# Patient Record
Sex: Female | Born: 2000 | Race: White | Hispanic: No | Marital: Single | Smoking: Never smoker
Health system: Southern US, Community
[De-identification: ages and names within clinical notes are randomized; demographics above are authoritative.]

## PROBLEM LIST (undated history)

## (undated) DIAGNOSIS — J3089 Other allergic rhinitis: Secondary | ICD-10-CM

## (undated) DIAGNOSIS — R300 Dysuria: Secondary | ICD-10-CM

## (undated) DIAGNOSIS — N920 Excessive and frequent menstruation with regular cycle: Secondary | ICD-10-CM

## (undated) DIAGNOSIS — R82998 Other abnormal findings in urine: Secondary | ICD-10-CM

## (undated) DIAGNOSIS — D649 Anemia, unspecified: Secondary | ICD-10-CM

## (undated) HISTORY — DX: Dysuria: R30.0

## (undated) HISTORY — DX: Excessive and frequent menstruation with regular cycle: N92.0

## (undated) HISTORY — DX: Other abnormal findings in urine: R82.998

---
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
## 2020-02-22 IMAGING — US US RENAL
1 series · 14 of 25 positions shown · non-contrast
Comparison: None.

CLINICAL DATA: Recurring urinary tract infection

EXAM:
RENAL / URINARY TRACT ULTRASOUND COMPLETE

[Series 1: us renal · 0.24mm/px · 14 of 33 slices shown]
[im 1/33]
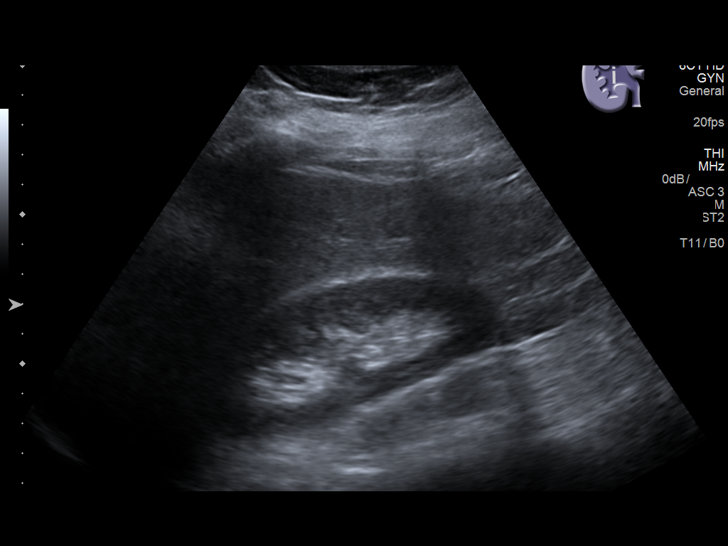
[im 3/33]
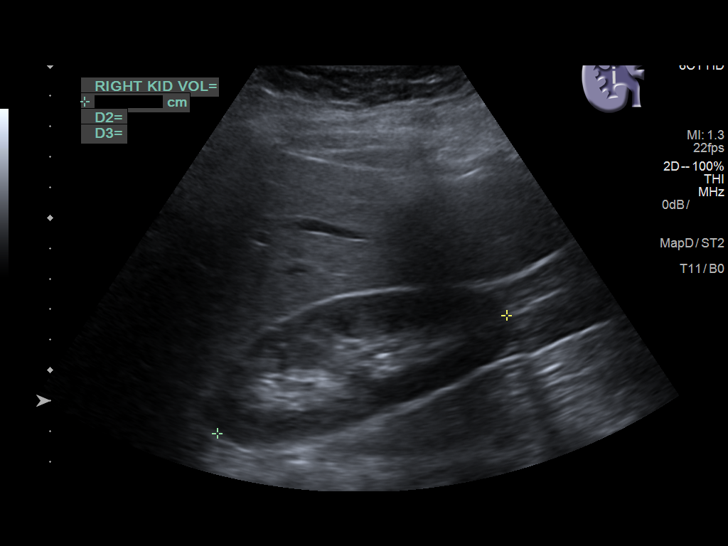
[im 6/33]
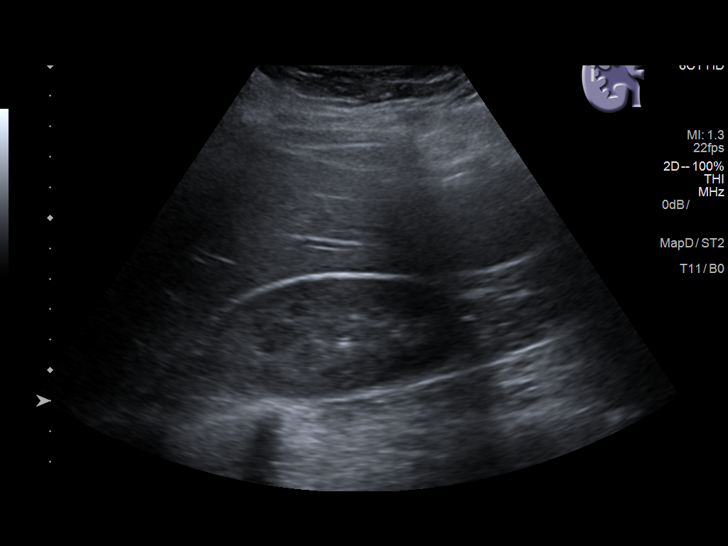
[im 9/33]
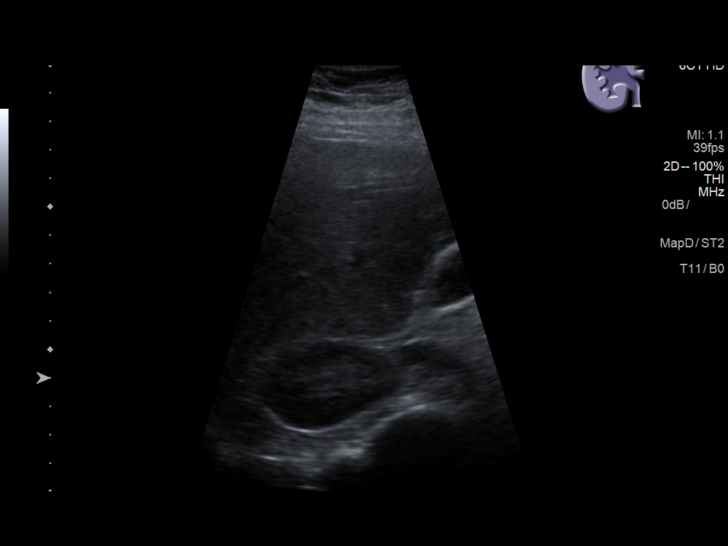
[im 11/33]
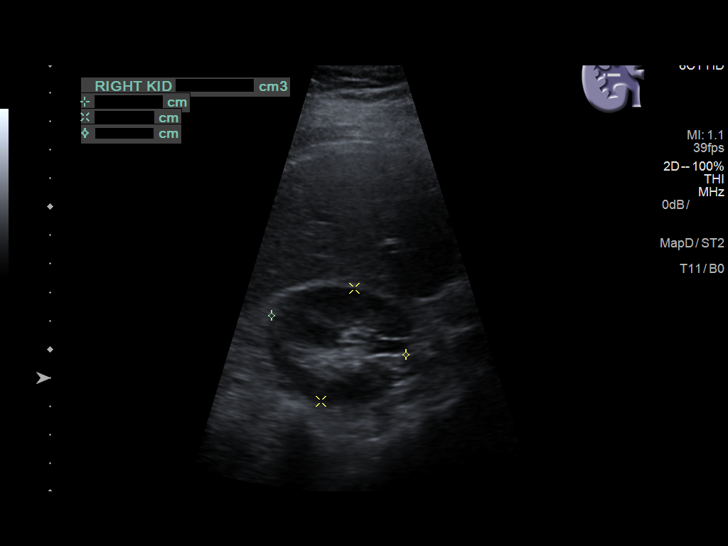
[im 13/33]
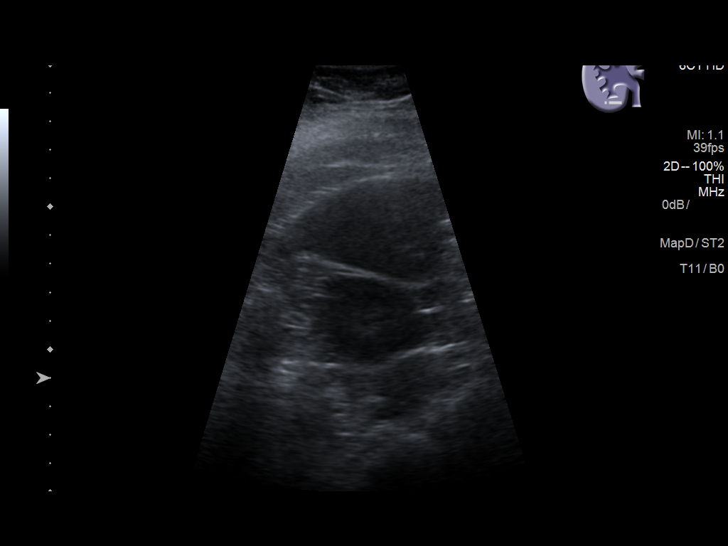
[im 15/33]
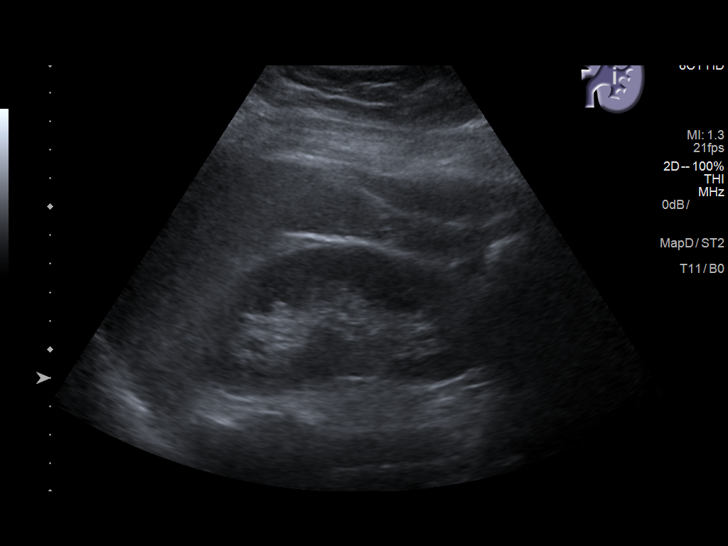
[im 18/33]
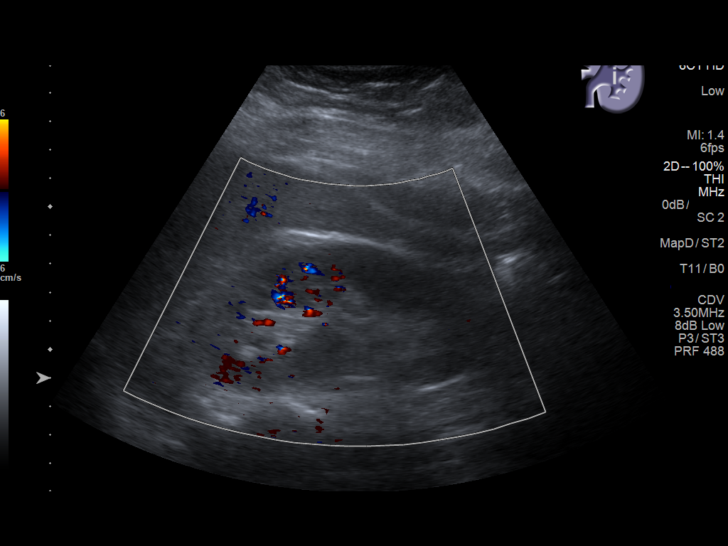
[im 21/33]
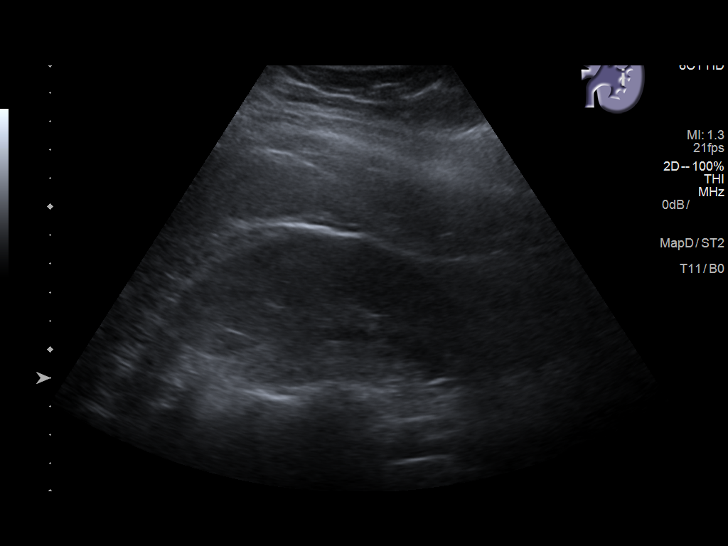
[im 22/33]
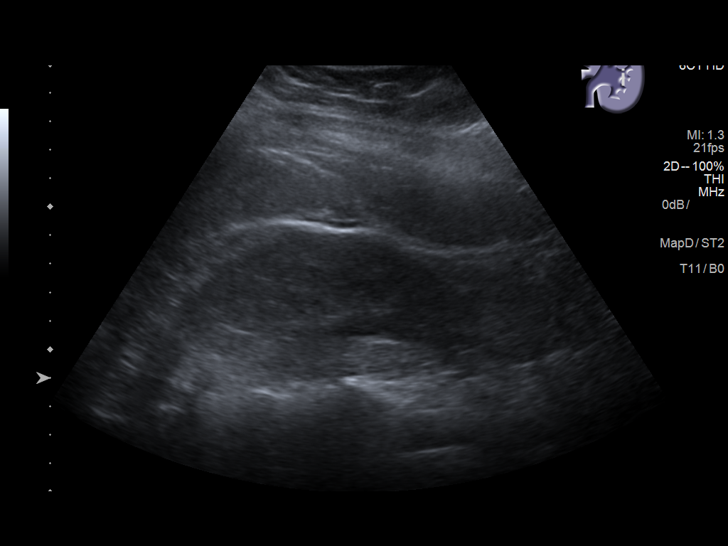
[im 25/33]
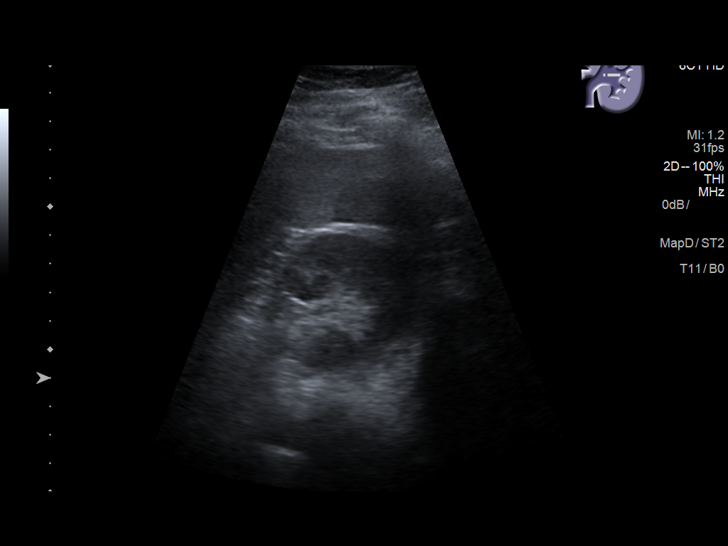
[im 27/33]
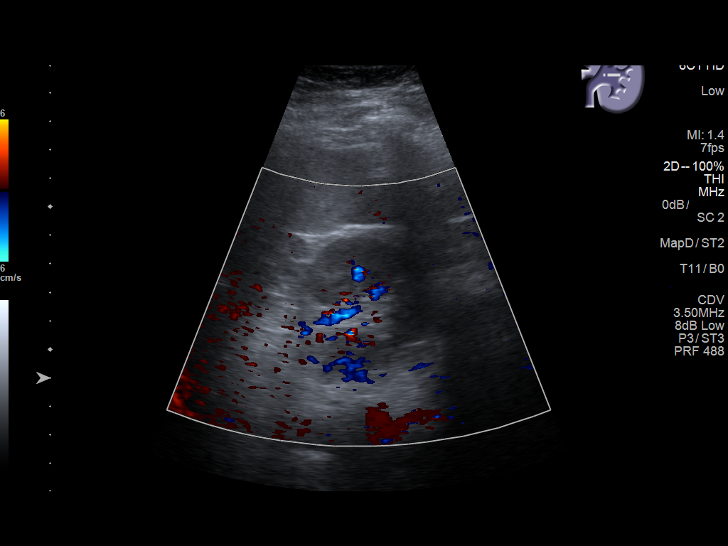
[im 30/33]
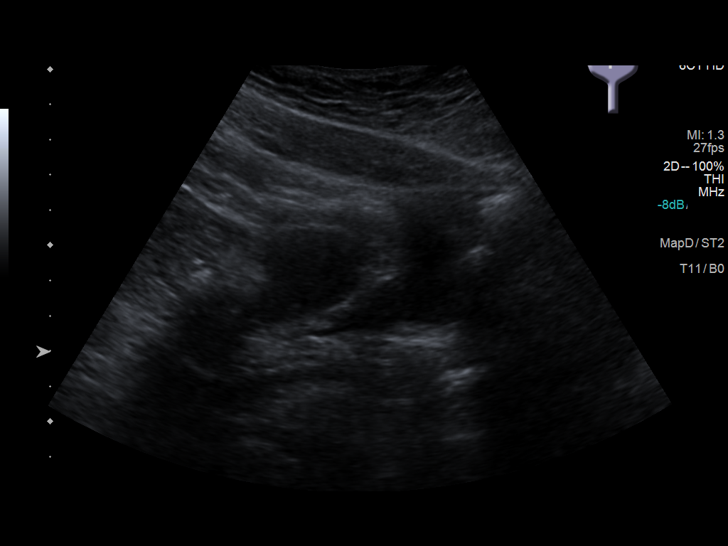
[im 33/33]
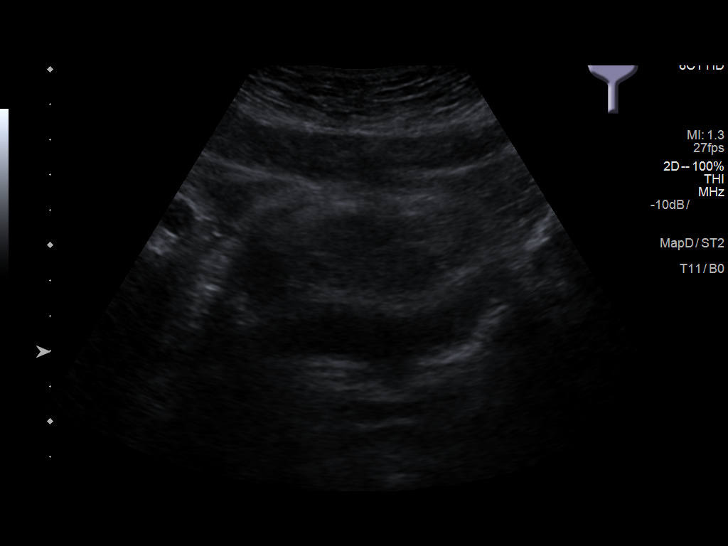

[14 of 25 positions shown; findings below may reference images not displayed]

FINDINGS: Right Kidney:

Renal measurements: 10.3 x 4.1 x 4.9 cm = volume: 108.7 mL .
Echogenicity within normal limits. No mass or hydronephrosis
visualized.

Left Kidney:

Renal measurements: 10 x 4.7 x 5.4 cm = volume: 132.5 mL.
Echogenicity within normal limits. No mass or hydronephrosis
visualized.

Bladder:

Appears normal for degree of bladder distention.
IMPRESSION: Normal renal ultrasound.

## 2020-05-20 ENCOUNTER — Other Ambulatory Visit: Payer: Self-pay

## 2020-05-20 ENCOUNTER — Ambulatory Visit
Admission: EM | Admit: 2020-05-20 | Discharge: 2020-05-20 | Disposition: A | Discharge status: Home / Self Care | Payer: BC Managed Care – PPO | Attending: Family Medicine | Admitting: Family Medicine

## 2020-05-20 DIAGNOSIS — B349 Viral infection, unspecified: Secondary | ICD-10-CM

## 2020-05-20 DIAGNOSIS — R222 Localized swelling, mass and lump, trunk: Secondary | ICD-10-CM

## 2020-05-20 DIAGNOSIS — M79671 Pain in right foot: Secondary | ICD-10-CM

## 2020-05-20 DIAGNOSIS — Z1152 Encounter for screening for COVID-19: Secondary | ICD-10-CM

## 2020-05-20 MED ORDER — NAPROXEN 500 MG PO TABS: 500.0000 mg | ORAL_TABLET | Freq: Two times a day (BID) | ORAL | 0 refills | Status: DC

## 2020-05-20 NOTE — ED Triage Notes (Signed)
Pt presents with cough and scratchy throat that began yesterday

## 2020-05-20 NOTE — ED Triage Notes (Signed)
Pt also has place on back, described as a hump , not painful  to be looked at and right foot pain , no injury

## 2020-05-20 NOTE — Discharge Instructions (Signed)
Foot pain-recommended continued Ace wrap, rest, ice, elevate. Naproxen for pain and inflammation twice a day for a week. Good supportive shoes. I believe the area on your back is lipoma. There is no concerns today. You can follow-up with surgery or dermatology as needed if symptoms worsen We are checking you for Covid for your cough and sore throat. Your lungs were clear today on exam. You can take over-the-counter medicines as needed for your cough Follow up as needed for continued or worsening symptoms

## 2020-05-21 NOTE — ED Provider Notes (Signed)
[name]    CSN: [id number] Arrival date & time: 05/20/20  1050      History   Chief Complaint Chief Complaint  Patient presents with  . Sore Throat  . Cough    HPI Cheryl Vega is a 20 y.o. female.   Patient is a 21 year old female who presents today with multiple complaints.  First complaint being sore throat, cough that started yesterday.  Symptoms have been constant.  Has not taken anything for symptoms.  No fevers, chills, chest congestion, shortness of breath. Patient also has a lump to upper back area.  This is not painful.  Her mom noticed this a couple months ago.  No redness, increased warmth to the area or drainage.  No injuries to the back.      Past Medical History:  Diagnosis Date  . Calcium oxalate crystals in urine    followed by [facility] nephrology  . Dysuria   . Menorrhagia     There are no problems to display for this patient.   No past surgical history on file.  OB History    Gravida  0   Para  0   Term  0   Preterm  0   AB  0   Living  0     SAB  0   IAB  0   Ectopic  0   Multiple  0   Live Births  0            Home Medications    Prior to Admission medications   Medication Sig Start Date End Date Taking? Authorizing Provider  naproxen (NAPROSYN) 500 MG tablet Take 1 tablet (500 mg total) by mouth 2 (two) times daily. 05/20/20  Yes Stirling Orton A, NP  medroxyPROGESTERone (DEPO-PROVERA) 150 MG/ML injection Inject into the muscle.    [provider]    Family History Family History  Problem Relation Age of Onset  . Hypertension Mother   . Hypertension Maternal Grandfather     Social History Social History   Tobacco Use  . Smoking status: Never Smoker  . Smokeless tobacco: Never Used  Vaping Use  . Vaping Use: Never used  Substance Use Topics  . Alcohol use: Never  . Drug use: Never     Allergies   Patient has no known allergies.   Review of Systems Review of Systems   Physical  Exam Triage Vital Signs ED Triage Vitals  Enc Vitals Group     BP 05/20/20 1136 134/89     Pulse Rate 05/20/20 1136 86     Resp 05/20/20 1136 16     Temp 05/20/20 1136 98.7 F (37.1 C)     Temp Source 05/20/20 1136 Oral     SpO2 05/20/20 1136 99 %     Weight --      Height --      Head Circumference --      Peak Flow --      Pain Score 05/20/20 1137 5     Pain Loc --      Pain Edu? --      Excl. in GC? --    No data found.  Updated Vital Signs BP 134/89 (BP Location: Left Arm)   Pulse 86   Temp 98.7 F (37.1 C) (Oral)   Resp 16   SpO2 99%   Visual Acuity Right Eye Distance:   Left Eye Distance:   Bilateral Distance:    Right Eye Near:  Left Eye Near:    Bilateral Near:     Physical Exam Vitals and nursing note reviewed.  Constitutional:      General: She is not in acute distress.    Appearance: Normal appearance. She is not ill-appearing, toxic-appearing or diaphoretic.  HENT:     Head: Normocephalic.     Right Ear: Tympanic membrane and ear canal normal.     Left Ear: Tympanic membrane and ear canal normal.     Nose: Nose normal.     Mouth/Throat:     Pharynx: Oropharynx is clear.  Eyes:     Conjunctiva/sclera: Conjunctivae normal.  Cardiovascular:     Rate and Rhythm: Normal rate and regular rhythm.  Pulmonary:     Effort: Pulmonary effort is normal.     Breath sounds: Normal breath sounds.  Musculoskeletal:        General: Normal range of motion.     Cervical back: Normal range of motion.       Back:     Comments: Soft palpable lump to upper back area No erythema, induration or drainage  Skin:    General: Skin is warm and dry.     Findings: No rash.  Neurological:     Mental Status: She is alert.  Psychiatric:        Mood and Affect: Mood normal.      UC Treatments / Results  Labs (all labs ordered are listed, but only abnormal results are displayed) Labs Reviewed  NOVEL CORONAVIRUS, NAA    EKG   Radiology No results  found.  Procedures Procedures (including critical care time)  Medications Ordered in UC Medications - No data to display  Initial Impression / Assessment and Plan / UC Course  I have reviewed the triage vital signs and the nursing notes.  Pertinent labs & imaging results that were available during my care of the patient were reviewed by me and considered in my medical decision making (see chart for details).     Foot pain Most likely some tendinitis versus plantar fasciitis.  Recommended Ace wrap, rest, ice and elevate.  Naproxen for pain inflammation twice a day for a week.  Good supportive shoes.  Lump to upper back area.  Most likely lipoma versus cyst. No concerns for infection at this time.  Recommended monitor and follow-up with specialty as needed  Viral illness No concerns on exam.  Lungs were clear. Checking for Covid based on symptoms. Over-the-counter medicines as needed Follow up as needed for continued or worsening symptoms  Final Clinical Impressions(s) / UC Diagnoses   Final diagnoses:  Arch pain of right foot  Lump of skin of back  Viral illness     Discharge Instructions     Foot pain-recommended continued Ace wrap, rest, ice, elevate. Naproxen for pain and inflammation twice a day for a week. Good supportive shoes. I believe the area on your back is lipoma. There is no concerns today. You can follow-up with surgery or dermatology as needed if symptoms worsen We are checking you for Covid for your cough and sore throat. Your lungs were clear today on exam. You can take over-the-counter medicines as needed for your cough Follow up as needed for continued or worsening symptoms     ED Prescriptions    Medication Sig Dispense Auth. Provider   naproxen (NAPROSYN) 500 MG tablet Take 1 tablet (500 mg total) by mouth 2 (two) times daily. 30 tablet Janace Aris, NP  PDMP not reviewed this encounter.   Dahlia Byes A, NP 05/21/20 1012

## 2020-05-22 LAB — NOVEL CORONAVIRUS, NAA: SARS-CoV-2, NAA: NOT DETECTED

## 2020-05-22 LAB — SARS-COV-2, NAA 2 DAY TAT

## 2020-12-01 ENCOUNTER — Other Ambulatory Visit: Payer: Self-pay | Admitting: General Surgery

## 2020-12-01 NOTE — Progress Notes (Signed)
Subjective:     Patient ID: Cheryl Vega is a 20 y.o. female.   HPI   The following portions of the patient's history were reviewed and updated as appropriate.   This a new patient is here today for: office visit. The patient has been referred by Dr. Delaine Lame at [facility] for evaluation of a mid upper back lipoma. Patient reports it has been present for 3 years. She does report some discomfort.    The patient is accompanied by her mother, Kara Pacer.     Patient graduated from [employer].  Just completed summer session at [employer].  Works at a Educational psychologist at [employer].       Chief Complaint  Patient presents with   Lipoma      BP 120/86   Pulse 74   Temp 36.6 C (97.9 F)   Ht 170.2 cm (5\' 7" )   Wt (!) 106.6 kg (235 lb)   SpO2 99%   BMI 36.81 kg/m        Past Medical History:  Diagnosis Date   No known problems             Past Surgical History:  Procedure Laterality Date   punch biopsy midline upper back   11/06/2020    Delaine Lame, MD at [facility]                OB History     Gravida  0   Para  0   Term  0   Preterm  0   AB  0   Living  0      SAB  0   IAB  0   Ectopic  0   Molar  0   Multiple  0   Live Births  0        Obstetric Comments  Age at first period 31               Social History          Socioeconomic History   Marital status: Single  Tobacco Use   Smoking status: Never Smoker   Smokeless tobacco: Never Used  Substance and Sexual Activity   Alcohol use: No   Drug use: No   Sexual activity: Defer        No Known Allergies   Current Medications        Current Outpatient Medications  Medication Sig Dispense Refill   azithromycin (ZITHROMAX) 250 MG tablet Take 2 tablets (500mg ) by mouth on Day 1. Take 1 tablet (250mg ) by mouth on Days 2-5. 6 tablet 0   ferrous sulfate 325 (65 FE) MG tablet TAKE 1 TABLET BY MOUTH ONCE A DAY ON AN EMPTY  STOMACH WITH ORANGE JUICE       brompheniramine-pseudoephed-DM (BROMFED DM) 2-30-10 mg/5 mL syrup Take 5 mLs by mouth every 6 (six) hours as needed (Patient not taking: No sig reported) 118 mL 0   medroxyPROGESTERone (DEPO-PROVERA) 150 mg/mL injection Inject 150 mg into the muscle every 3 (three) months. (Patient not taking: Reported on 12/01/2020)        No current facility-administered medications for this visit.             Family History  Problem Relation Age of Onset   High blood pressure (Hypertension) Mother     No Known Problems Father     High blood pressure (Hypertension) Maternal Grandfather  Labs and Radiology:    [facility] notes from November 06, 2020 reviewed: Description of a 12 cm ill-defined subcutaneous nodule.  Biopsy completed.  Pathology showed mature adipose tissue.             Review of Systems  Constitutional: Negative for chills and fever.  Respiratory: Negative for cough.          Objective:   Physical Exam Exam conducted with a chaperone present.  Constitutional:      Appearance: Normal appearance.  Cardiovascular:     Rate and Rhythm: Normal rate and regular rhythm.     Pulses: Normal pulses.     Heart sounds: Normal heart sounds.  Pulmonary:     Effort: Pulmonary effort is normal.     Breath sounds: Normal breath sounds.  Musculoskeletal:     Cervical back: Neck supple.  Skin:    General: Skin is warm and dry.     Comments: 9-10 cm mass at the base of the neck, midline without evidence of fixation to the deep fascia consistent with a lipoma.  Neurological:     Mental Status: She is alert and oriented to person, place, and time.  Psychiatric:        Mood and Affect: Mood normal.        Behavior: Behavior normal.           Assessment:     Large upper back lipoma, symptomatic with local discomfort.    Plan:     Based on the size and location, I think this would be best removed under anesthesia as an outpatient  procedure.   This will be scheduled at a convenient date at [facility] around the patient's work schedule and upcoming fall semester at [employer].   This note is partially prepared by Ledell Noss, CMA acting as a scribe in the presence of Dr. Hervey Ard, MD.    The documentation recorded by the scribe accurately reflects the service I personally performed and the decisions made by me.    Robert Bellow, MD FACS

## 2020-12-28 ENCOUNTER — Encounter
Admission: RE | Admit: 2020-12-28 | Discharge: 2020-12-28 | Disposition: A | Discharge status: Home / Self Care | Payer: BC Managed Care – PPO | Source: Ambulatory Visit | Attending: General Surgery | Admitting: General Surgery

## 2020-12-28 ENCOUNTER — Other Ambulatory Visit: Payer: Self-pay

## 2020-12-28 HISTORY — DX: Other allergic rhinitis: J30.89

## 2020-12-28 HISTORY — DX: Anemia, unspecified: D64.9

## 2020-12-28 NOTE — Patient Instructions (Signed)
Your procedure is scheduled on: 01/04/21 Report to [facility]. To find out your arrival time please call [phone] between 1PM - 3PM on 01/01/21.  Remember: Instructions that are not followed completely may result in serious medical risk, up to and including death, or upon the discretion of your surgeon and anesthesiologist your surgery may need to be rescheduled.     _X__ 1. Do not eat food after midnight the night before your procedure.                 No gum chewing or hard candies. You may drink clear liquids up to 2 hours                 before you are scheduled to arrive for your surgery- DO not drink clear                 liquids within 2 hours of the start of your surgery.                 Clear Liquids include:  water, apple juice without pulp, clear carbohydrate                 drink such as Clearfast or Gatorade, Black Coffee or Tea (Do not add                 anything to coffee or tea). Diabetics water only  __X__2.  On the morning of surgery brush your teeth with toothpaste and water, you                 may rinse your mouth with mouthwash if you wish.  Do not swallow any              toothpaste of mouthwash.     _X__ 3.  No Alcohol for 24 hours before or after surgery.   _X__ 4.  Do Not Smoke or use e-cigarettes For 24 Hours Prior to Your Surgery.                 Do not use any chewable tobacco products for at least 6 hours prior to                 surgery.  ____  5.  Bring all medications with you on the day of surgery if instructed.   __X__  6.  Notify your doctor if there is any change in your medical condition      (cold, fever, infections).     Do not wear jewelry, make-up, hairpins, clips or nail polish. Do not wear lotions, powders, or perfumes.  Do not shave body hair 48 hours prior to surgery. Men may shave face and neck. Do not bring valuables to the hospital.    [facility] is not responsible for any  belongings or valuables.  Contacts, dentures/partials or body piercings may not be worn into surgery. Bring a case for your contacts, glasses or hearing aids, a denture cup will be supplied. Leave your suitcase in the car. After surgery it may be brought to your room. For patients admitted to the hospital, discharge time is determined by your treatment team.   Patients discharged the day of surgery will not be allowed to drive home.   Please read over the following fact sheets that you were given:     __X__ Take these medicines the morning of surgery with A SIP OF WATER:  1. May take Allegra if needed  2.   3.   4.  5.  6.  ____ Fleet Enema (as directed)   __X__ Use CHG Soap/SAGE wipes as directed  or may use Original Dial Soap (orange/yellow bar)  ____ Use inhalers on the day of surgery  ____ Stop metformin/Janumet/Farxiga 2 days prior to surgery    ____ Take 1/2 of usual insulin dose the night before surgery. No insulin the morning          of surgery.   ____ Stop Blood Thinners Coumadin/Plavix/Xarelto/Pleta/Pradaxa/Eliquis/Effient/Aspirin  on   Or contact your Surgeon, Cardiologist or Medical Doctor regarding  ability to stop your blood thinners  __X__ Stop Anti-inflammatories 7 days before surgery such as Advil, Ibuprofen, Motrin,  BC or Goodies Powder, Naprosyn, Naproxen, Aleve, Aspirin   May useTylenol only until after surgery  __X__ Stop all herbal supplements, fish oil or vitamin E until after surgery.    ____ Bring C-Pap to the hospital.

## 2021-01-04 ENCOUNTER — Ambulatory Visit: Payer: BC Managed Care – PPO | Admitting: Anesthesiology

## 2021-01-04 ENCOUNTER — Encounter: Payer: Self-pay | Admitting: General Surgery

## 2021-01-04 ENCOUNTER — Other Ambulatory Visit: Payer: Self-pay

## 2021-01-04 ENCOUNTER — Ambulatory Visit
Admission: RE | Admit: 2021-01-04 | Discharge: 2021-01-04 | Disposition: A | Discharge status: Home / Self Care | Payer: BC Managed Care – PPO | Attending: General Surgery | Admitting: General Surgery

## 2021-01-04 ENCOUNTER — Encounter
Admission: RE | Disposition: A | Discharge status: Home / Self Care | Payer: Self-pay | Source: Home / Self Care | Attending: General Surgery

## 2021-01-04 DIAGNOSIS — D171 Benign lipomatous neoplasm of skin and subcutaneous tissue of trunk: Secondary | ICD-10-CM | POA: Insufficient documentation

## 2021-01-04 HISTORY — PX: LIPOMA EXCISION: SHX5283

## 2021-01-04 LAB — POCT PREGNANCY, URINE: Preg Test, Ur: NEGATIVE

## 2021-01-04 SURGERY — EXCISION LIPOMA
Anesthesia: General

## 2021-01-04 MED ORDER — LIDOCAINE HCL (PF) 2 % IJ SOLN
INTRAMUSCULAR | Status: AC
  Filled 2021-01-04: qty 5

## 2021-01-04 MED ORDER — ORAL CARE MOUTH RINSE: 15.0000 mL | Freq: Once | OROMUCOSAL | Status: DC

## 2021-01-04 MED ORDER — FENTANYL CITRATE (PF) 100 MCG/2ML IJ SOLN: 25.0000 ug | INTRAMUSCULAR | Status: DC | PRN

## 2021-01-04 MED ORDER — CHLORHEXIDINE GLUCONATE CLOTH 2 % EX PADS: 6.0000 | MEDICATED_PAD | Freq: Once | CUTANEOUS | Status: DC

## 2021-01-04 MED ORDER — CHLORHEXIDINE GLUCONATE 0.12 % MT SOLN
15.0000 mL | Freq: Once | OROMUCOSAL | Status: DC
Start: 2021-01-04 — End: 2021-01-04

## 2021-01-04 MED ORDER — DEXMEDETOMIDINE (PRECEDEX) IN NS 20 MCG/5ML (4 MCG/ML) IV SYRINGE
PREFILLED_SYRINGE | INTRAVENOUS | Status: DC | PRN
  Administered 2021-01-04: 8 ug via INTRAVENOUS
  Administered 2021-01-04: 12 ug via INTRAVENOUS

## 2021-01-04 MED ORDER — DEXMEDETOMIDINE (PRECEDEX) IN NS 20 MCG/5ML (4 MCG/ML) IV SYRINGE
PREFILLED_SYRINGE | INTRAVENOUS | Status: AC
  Filled 2021-01-04: qty 5

## 2021-01-04 MED ORDER — GLYCOPYRROLATE 0.2 MG/ML IJ SOLN
INTRAMUSCULAR | Status: DC | PRN
  Administered 2021-01-04: .2 mg via INTRAVENOUS

## 2021-01-04 MED ORDER — LACTATED RINGERS IV SOLN: INTRAVENOUS | Status: DC

## 2021-01-04 MED ORDER — FENTANYL CITRATE (PF) 100 MCG/2ML IJ SOLN
INTRAMUSCULAR | Status: DC | PRN
  Administered 2021-01-04 (×2): 50 ug via INTRAVENOUS

## 2021-01-04 MED ORDER — DEXAMETHASONE SODIUM PHOSPHATE 10 MG/ML IJ SOLN
INTRAMUSCULAR | Status: AC
  Filled 2021-01-04: qty 1

## 2021-01-04 MED ORDER — BUPIVACAINE-EPINEPHRINE (PF) 0.5% -1:200000 IJ SOLN
INTRAMUSCULAR | Status: DC | PRN
  Administered 2021-01-04: 30 mL

## 2021-01-04 MED ORDER — OXYCODONE HCL 5 MG/5ML PO SOLN: 5.0000 mg | Freq: Once | ORAL | Status: AC | PRN

## 2021-01-04 MED ORDER — FAMOTIDINE 20 MG PO TABS
20.0000 mg | ORAL_TABLET | Freq: Once | ORAL | Status: AC
  Administered 2021-01-04: 20 mg via ORAL

## 2021-01-04 MED ORDER — ROCURONIUM BROMIDE 100 MG/10ML IV SOLN
INTRAVENOUS | Status: DC | PRN
  Administered 2021-01-04: 40 mg via INTRAVENOUS

## 2021-01-04 MED ORDER — GLYCOPYRROLATE 0.2 MG/ML IJ SOLN
INTRAMUSCULAR | Status: AC
  Filled 2021-01-04: qty 1

## 2021-01-04 MED ORDER — CHLORHEXIDINE GLUCONATE 0.12 % MT SOLN
OROMUCOSAL | Status: AC
  Filled 2021-01-04: qty 15

## 2021-01-04 MED ORDER — LIDOCAINE HCL (CARDIAC) PF 100 MG/5ML IV SOSY
PREFILLED_SYRINGE | INTRAVENOUS | Status: DC | PRN
  Administered 2021-01-04: 100 mg via INTRAVENOUS

## 2021-01-04 MED ORDER — FAMOTIDINE 20 MG PO TABS
ORAL_TABLET | ORAL | Status: AC
  Filled 2021-01-04: qty 1

## 2021-01-04 MED ORDER — FENTANYL CITRATE (PF) 100 MCG/2ML IJ SOLN
INTRAMUSCULAR | Status: AC
  Filled 2021-01-04: qty 2

## 2021-01-04 MED ORDER — HYDROCODONE-ACETAMINOPHEN 5-325 MG PO TABS: 1.0000 | ORAL_TABLET | ORAL | 0 refills | Status: AC | PRN

## 2021-01-04 MED ORDER — ONDANSETRON HCL 4 MG/2ML IJ SOLN
INTRAMUSCULAR | Status: AC
  Filled 2021-01-04: qty 2

## 2021-01-04 MED ORDER — DEXAMETHASONE SODIUM PHOSPHATE 10 MG/ML IJ SOLN
INTRAMUSCULAR | Status: DC | PRN
  Administered 2021-01-04: 10 mg via INTRAVENOUS

## 2021-01-04 MED ORDER — OXYCODONE HCL 5 MG PO TABS
5.0000 mg | ORAL_TABLET | Freq: Once | ORAL | Status: AC | PRN
  Administered 2021-01-04: 5 mg via ORAL

## 2021-01-04 MED ORDER — PROPOFOL 10 MG/ML IV BOLUS
INTRAVENOUS | Status: DC | PRN
  Administered 2021-01-04: 180 mg via INTRAVENOUS

## 2021-01-04 MED ORDER — OXYCODONE HCL 5 MG PO TABS
ORAL_TABLET | ORAL | Status: AC
  Filled 2021-01-04: qty 1

## 2021-01-04 MED ORDER — BUPIVACAINE-EPINEPHRINE (PF) 0.5% -1:200000 IJ SOLN
INTRAMUSCULAR | Status: AC
  Filled 2021-01-04: qty 30

## 2021-01-04 MED ORDER — MIDAZOLAM HCL 2 MG/2ML IJ SOLN
INTRAMUSCULAR | Status: AC
  Filled 2021-01-04: qty 2

## 2021-01-04 MED ORDER — PROPOFOL 10 MG/ML IV BOLUS
INTRAVENOUS | Status: AC
  Filled 2021-01-04: qty 20

## 2021-01-04 MED ORDER — ACETAMINOPHEN 10 MG/ML IV SOLN
INTRAVENOUS | Status: AC
  Filled 2021-01-04: qty 100

## 2021-01-04 MED ORDER — MIDAZOLAM HCL 2 MG/2ML IJ SOLN
INTRAMUSCULAR | Status: DC | PRN
Start: 2021-01-04 — End: 2021-01-04
  Administered 2021-01-04: 2 mg via INTRAVENOUS

## 2021-01-04 MED ORDER — SUGAMMADEX SODIUM 200 MG/2ML IV SOLN
INTRAVENOUS | Status: DC | PRN
  Administered 2021-01-04: 200 mg via INTRAVENOUS

## 2021-01-04 SURGICAL SUPPLY — 41 items
APL PRP STRL LF DISP 70% ISPRP (MISCELLANEOUS) ×1
CANISTER SUCT 1200ML W/VALVE (MISCELLANEOUS) ×2 IMPLANT
CHLORAPREP W/TINT 26 (MISCELLANEOUS) ×2 IMPLANT
DRAPE 3/4 80X56 (DRAPES) IMPLANT
DRAPE LAPAROTOMY 100X77 ABD (DRAPES) ×2 IMPLANT
DRSG OPSITE POSTOP 4X6 (GAUZE/BANDAGES/DRESSINGS) ×2 IMPLANT
DRSG TEGADERM 4X4.75 (GAUZE/BANDAGES/DRESSINGS) IMPLANT
DRSG TELFA 4X3 1S NADH ST (GAUZE/BANDAGES/DRESSINGS) ×2 IMPLANT
ELECT REM PT RETURN 9FT ADLT (ELECTROSURGICAL) ×2
ELECTRODE REM PT RTRN 9FT ADLT (ELECTROSURGICAL) ×1 IMPLANT
GAUZE 4X4 16PLY ~~LOC~~+RFID DBL (SPONGE) ×2 IMPLANT
GAUZE SPONGE 4X4 12PLY STRL (GAUZE/BANDAGES/DRESSINGS) ×2 IMPLANT
GLOVE SURG ENC MOIS LTX SZ7.5 (GLOVE) ×2 IMPLANT
GLOVE SURG UNDER LTX SZ8 (GLOVE) ×2 IMPLANT
GOWN STRL REUS W/ TWL LRG LVL3 (GOWN DISPOSABLE) ×1 IMPLANT
GOWN STRL REUS W/ TWL XL LVL3 (GOWN DISPOSABLE) ×1 IMPLANT
GOWN STRL REUS W/TWL LRG LVL3 (GOWN DISPOSABLE) ×2
GOWN STRL REUS W/TWL XL LVL3 (GOWN DISPOSABLE) ×2
KIT TURNOVER KIT A (KITS) ×2 IMPLANT
LABEL OR SOLS (LABEL) ×2 IMPLANT
MANIFOLD NEPTUNE II (INSTRUMENTS) ×2 IMPLANT
MARGIN MAP 10MM (MISCELLANEOUS) ×2 IMPLANT
NEEDLE HYPO 22GX1.5 SAFETY (NEEDLE) ×2 IMPLANT
NS IRRIG 500ML POUR BTL (IV SOLUTION) ×2 IMPLANT
PACK BASIN MINOR ARMC (MISCELLANEOUS) ×2 IMPLANT
PAD PREP 24X41 OB/GYN DISP (PERSONAL CARE ITEMS) IMPLANT
STOCKINETTE STRL 6IN 960660 (GAUZE/BANDAGES/DRESSINGS) IMPLANT
STRIP CLOSURE SKIN 1/2X4 (GAUZE/BANDAGES/DRESSINGS) IMPLANT
SUT ETHILON 3-0 (SUTURE) ×2 IMPLANT
SUT ETHILON 4-0 (SUTURE)
SUT ETHILON 4-0 FS2 18XMFL BLK (SUTURE)
SUT VIC AB 2-0 CT1 (SUTURE) IMPLANT
SUT VIC AB 3-0 SH 27 (SUTURE) ×2
SUT VIC AB 3-0 SH 27X BRD (SUTURE) ×1 IMPLANT
SUT VIC AB 4-0 FS2 27 (SUTURE) ×2 IMPLANT
SUT VICRYL+ 3-0 144IN (SUTURE) ×2 IMPLANT
SUTURE ETHLN 4-0 FS2 18XMF BLK (SUTURE) IMPLANT
SWABSTK COMLB BENZOIN TINCTURE (MISCELLANEOUS) IMPLANT
SYR 10ML LL (SYRINGE) ×2 IMPLANT
SYR BULB IRRIG 60ML STRL (SYRINGE) ×2 IMPLANT
TAPE TRANSPORE STRL 2 31045 (GAUZE/BANDAGES/DRESSINGS) ×2 IMPLANT

## 2021-01-04 NOTE — OR Nursing (Signed)
Dr. Bary Castilla in to see pt in postop prior to discharge.

## 2021-01-04 NOTE — Anesthesia Preprocedure Evaluation (Signed)
Anesthesia Evaluation  Patient identified by MRN, date of birth, ID band Patient awake    Reviewed: Allergy & Precautions, NPO status , Patient's Chart, lab work & pertinent test results  History of Anesthesia Complications Negative for: history of anesthetic complications  Airway Mallampati: III  TM Distance: >3 FB Neck ROM: full    Dental  (+) Chipped   Pulmonary neg pulmonary ROS, neg shortness of breath,    Pulmonary exam normal        Cardiovascular Exercise Tolerance: Good (-) angina(-) Past MI negative cardio ROS Normal cardiovascular exam     Neuro/Psych negative neurological ROS  negative psych ROS   GI/Hepatic negative GI ROS, Neg liver ROS, neg GERD  ,  Endo/Other  negative endocrine ROS  Renal/GU      Musculoskeletal   Abdominal   Peds  Hematology negative hematology ROS (+)   Anesthesia Other Findings Past Medical History: No date: Anemia No date: Calcium oxalate crystals in urine     Comment:  followed by [facility] nephrology No date: Dysuria No date: Environmental and seasonal allergies No date: Menorrhagia  History reviewed. No pertinent surgical history.  BMI    Body Mass Index: 37.60 kg/m      Reproductive/Obstetrics negative OB ROS                             Anesthesia Physical Anesthesia Plan  ASA: 2  Anesthesia Plan: General ETT   Post-op Pain Management:    Induction: Intravenous  PONV Risk Score and Plan: Ondansetron, Dexamethasone, Midazolam and Treatment may vary due to age or medical condition  Airway Management Planned: Oral ETT  Additional Equipment:   Intra-op Plan:   Post-operative Plan: Extubation in OR  Informed Consent: I have reviewed the patients History and Physical, chart, labs and discussed the procedure including the risks, benefits and alternatives for the proposed anesthesia with the patient or authorized representative who  has indicated his/her understanding and acceptance.     Dental Advisory Given  Plan Discussed with: Anesthesiologist, CRNA and Surgeon  Anesthesia Plan Comments: (Patient consented for risks of anesthesia including but not limited to:  - adverse reactions to medications - damage to eyes, teeth, lips or other oral mucosa - nerve damage due to positioning  - sore throat or hoarseness - Damage to heart, brain, nerves, lungs, other parts of body or loss of life  Patient voiced understanding.)        Anesthesia Quick Evaluation

## 2021-01-04 NOTE — H&P (Signed)
Cheryl Vega [id number] July 12, 2000     HPI:  20 y/o with a large lipoma of the upper back. For excision.   Medications Prior to Admission  Medication Sig Dispense Refill Last Dose   ferrous sulfate 325 (65 FE) MG tablet Take 325 mg by mouth daily.   Past Week   fexofenadine (ALLEGRA) 180 MG tablet Take 180 mg by mouth daily as needed for allergies or rhinitis.   Past Week   XULANE 150-35 MCG/24HR transdermal patch Place 1 patch onto the skin once a week.   01/04/2021   naproxen (NAPROSYN) 500 MG tablet Take 1 tablet (500 mg total) by mouth 2 (two) times daily. (Patient not taking: No sig reported) 30 tablet 0 Not Taking   No Known Allergies Past Medical History:  Diagnosis Date   Anemia    Calcium oxalate crystals in urine    followed by [facility] nephrology   Dysuria    Environmental and seasonal allergies    Menorrhagia    History reviewed. No pertinent surgical history. Social History   Socioeconomic History   Marital status: Single    Spouse name: Not on file   Number of children: Not on file   Years of education: Not on file   Highest education level: Not on file  Occupational History   Not on file  Tobacco Use   Smoking status: Never   Smokeless tobacco: Never  Vaping Use   Vaping Use: Never used  Substance and Sexual Activity   Alcohol use: Never   Drug use: Never   Sexual activity: Never    Birth control/protection: Patch  Other Topics Concern   Not on file  Social History Narrative   Not on file   Social Determinants of Health   Financial Resource Strain: Not on file  Food Insecurity: Not on file  Transportation Needs: Not on file  Physical Activity: Not on file  Stress: Not on file  Social Connections: Not on file  Intimate Partner Violence: Not on file   Social History   Social History Narrative   Not on file     ROS: Negative.     PE: HEENT: Negative. Lungs: Clear. Cardio: RR.  Assessment/Plan:  Proceed with planned lipoma excision.   Forest Gleason [facility] 01/04/2021

## 2021-01-04 NOTE — Anesthesia Procedure Notes (Signed)
Procedure Name: Intubation Date/Time: 01/04/2021 9:11 AM Performed by: Doreen Salvage, CRNA Pre-anesthesia Checklist: Patient identified, Patient being monitored, Timeout performed, Emergency Drugs available and Suction available Patient Re-evaluated:Patient Re-evaluated prior to induction Oxygen Delivery Method: Circle system utilized Preoxygenation: Pre-oxygenation with 100% oxygen Induction Type: IV induction Ventilation: Mask ventilation without difficulty Laryngoscope Size: Mac, 3 and McGraph Grade View: Grade I Tube type: Oral Tube size: 7.0 mm Number of attempts: 1 Airway Equipment and Method: Stylet Placement Confirmation: ETT inserted through vocal cords under direct vision, positive ETCO2 and breath sounds checked- equal and bilateral Secured at: 22 cm Tube secured with: Tape Dental Injury: Teeth and Oropharynx as per pre-operative assessment

## 2021-01-04 NOTE — Anesthesia Postprocedure Evaluation (Signed)
Anesthesia Post Note  Patient: Cheryl Vega  Procedure(s) Performed: EXCISION LIPOMA  Patient location during evaluation: PACU Anesthesia Type: General Level of consciousness: awake and alert Pain management: pain level controlled Vital Signs Assessment: post-procedure vital signs reviewed and stable Respiratory status: spontaneous breathing, nonlabored ventilation, respiratory function stable and patient connected to nasal cannula oxygen Cardiovascular status: blood pressure returned to baseline and stable Postop Assessment: no apparent nausea or vomiting Anesthetic complications: no   No notable events documented.   Last Vitals:  Vitals:   01/04/21 1110 01/04/21 1142  BP: 104/68 116/71  Pulse: 80 62  Resp: 18 16  Temp: 36.5 C   SpO2: 100% 99%    Last Pain:  Vitals:   01/04/21 1142  TempSrc:   PainSc: 2                  Precious Haws [place]

## 2021-01-04 NOTE — Discharge Instructions (Signed)

## 2021-01-04 NOTE — Transfer of Care (Signed)
Immediate Anesthesia Transfer of Care Note  Patient: Cheryl Vega  Procedure(s) Performed: Procedure(s) with comments: EXCISION LIPOMA (N/A) - upper back  Patient Location: PACU  Anesthesia Type:General  Level of Consciousness: sedated  Airway & Oxygen Therapy: Patient Spontanous Breathing and Patient connected to face mask oxygen  Post-op Assessment: Report given to RN and Post -op Vital signs reviewed and stable  Post vital signs: Reviewed and stable  Last Vitals:  Vitals:   01/04/21 1024 01/04/21 1030  BP: (!) 114/59 109/63  Pulse: 77 66  Resp: (!) 8 (!) 21  Temp: 36.5 C   SpO2: 0000000 [id number]    Complications: No apparent anesthesia complications

## 2021-01-04 NOTE — Op Note (Signed)
Preoperative diagnosis: Lipoma of the upper back, .  Postoperative diagnosis: Same.  Procedure: Excision of midline back pain.  Operating Surgeon: Hervey Ard, MD.  Anesthesia: General endotracheal, Marcaine 0.5% with 1: 200,000's of epinephrine: 30 cc.  Estimated blood loss: 15 cc.  Clinical note: This 20 year old woman has a 3-year history of an enlarging mass in the upper back in the midline.  Biopsy of the dermatologist suggested lipoma.  This is a 9 x 10 cm mass at the base of the neck that did not show evidence of fixation fashion.  With this prominent she was felt to be candidate for excision.  Operative note: Patient underwent general anesthesia and tolerated this well.  She was rolled to the prone position and appropriately padded.  The area was cleansed with ChloraPrep and draped.  The area of palpable thickening was outlined.  The patient had a 1 cm diameter scar from the biopsy site it was elected to make use of a transversely oriented elliptical incision to include the scar.  The skin was incised sharply and the remaining dissection with electrocautery.  Ellipse of skin was sent separately.  The deeper adipose tissue in the back was entered and there was no transition to the typical large globular lipoma.  It was elected to excise the area prominent thickening, previously marked, down to the deep muscle fascia.  The fascia was left intact.  1 area of bleeding was controlled with a 3-0 Vicryl figure-of-eight suture.  Once this fat pad was orientated it was sent in formalin for routine histology.  This again measured about 9-10 cm in diameter and about 4 cm in thickness.  The deep tissue was approximated with interrupted 2-0 Vicryl figure-of-eight sutures with anchoring bites into the deep muscle fascia.  The subcutaneous fat was approximated in similar fashion.  The skin was closed with a running 4-0 Vicryl subcuticular suture.  Benzoin and Steri-Strips followed by a honeycomb dressing  was applied.  Patient supine position.  She was extubated and taken to the

## 2021-01-05 LAB — SURGICAL PATHOLOGY

## 2022-08-29 ENCOUNTER — Ambulatory Visit
Admission: EM | Admit: 2022-08-29 | Discharge: 2022-08-29 | Disposition: A | Discharge status: Home / Self Care | Payer: BC Managed Care – PPO | Attending: Emergency Medicine | Admitting: Emergency Medicine

## 2022-08-29 ENCOUNTER — Encounter: Payer: Self-pay | Admitting: Emergency Medicine

## 2022-08-29 DIAGNOSIS — L237 Allergic contact dermatitis due to plants, except food: Secondary | ICD-10-CM | POA: Diagnosis not present

## 2022-08-29 MED ORDER — PREDNISONE 10 MG (21) PO TBPK: ORAL_TABLET | Freq: Every day | ORAL | 0 refills | Status: DC

## 2022-08-29 NOTE — ED Provider Notes (Signed)
Cheryl Vega    CSN: [id number] Arrival date & time: 08/29/22  0801      History   Chief Complaint Chief Complaint  Patient presents with   Rash    HPI Cheryl Vega is a 22 y.o. female.  Patient presents with 1 week history of pruritic rash that started on her right thigh and has spread to her other leg.  She has small areas on her abdomen and on her right arm also.  No open wounds or drainage.  She attributes the rash to poison ivy.  She has been using Calamine lotion but the rash is not improving.  She take Allegra daily for seasonal allergies.  She denies fever, chills, sore throat, cough, shortness of breath, or other symptoms.  She denies current pregnancy or breastfeeding.      The history is provided by the patient and medical records.    Past Medical History:  Diagnosis Date   Anemia    Calcium oxalate crystals in urine    followed by [facility] nephrology   Dysuria    Environmental and seasonal allergies    Menorrhagia     There are no problems to display for this patient.   Past Surgical History:  Procedure Laterality Date   LIPOMA EXCISION N/A 01/04/2021   Procedure: EXCISION LIPOMA;  Surgeon: Earline Mayotte, MD;  Location: [facility] ORS;  Service: General;  Laterality: N/A;  upper back    OB History     Gravida  0   Para  0   Term  0   Preterm  0   AB  0   Living  0      SAB  0   IAB  0   Ectopic  0   Multiple  0   Live Births  0            Home Medications    Prior to Admission medications   Medication Sig Start Date End Date Taking? Authorizing Provider  predniSONE (STERAPRED UNI-PAK 21 TAB) 10 MG (21) TBPK tablet Take by mouth daily. As directed 08/29/22  Yes Mickie Bail, NP  ferrous sulfate 325 (65 FE) MG tablet Take 325 mg by mouth daily. 11/26/20   [provider]  fexofenadine (ALLEGRA) 180 MG tablet Take 180 mg by mouth daily as needed for allergies or rhinitis.    [provider]  Burr Medico 150-35  MCG/24HR transdermal patch Place 1 patch onto the skin once a week. 12/18/20   [provider]    Family History Family History  Problem Relation Age of Onset   Hypertension Mother    Hypertension Maternal Grandfather     Social History Social History   Tobacco Use   Smoking status: Never   Smokeless tobacco: Never  Vaping Use   Vaping Use: Never used  Substance Use Topics   Alcohol use: Never   Drug use: Never     Allergies   Patient has no known allergies.   Review of Systems Review of Systems  Constitutional:  Negative for chills and fever.  HENT:  Negative for ear pain and sore throat.   Respiratory:  Negative for cough and shortness of breath.   Cardiovascular:  Negative for chest pain and palpitations.  Skin:  Positive for rash.  All other systems reviewed and are negative.    Physical Exam Triage Vital Signs ED Triage Vitals  Enc Vitals Group     BP  Pulse      Resp      Temp      Temp src      SpO2      Weight      Height      Head Circumference      Peak Flow      Pain Score      Pain Loc      Pain Edu?      Excl. in GC?    No data found.  Updated Vital Signs BP 114/74 (BP Location: Left Arm)   Pulse 73   Temp 97.9 F (36.6 C) (Oral)   Resp 18   SpO2 99%   Visual Acuity Right Eye Distance:   Left Eye Distance:   Bilateral Distance:    Right Eye Near:   Left Eye Near:    Bilateral Near:     Physical Exam Vitals and nursing note reviewed.  Constitutional:      General: She is not in acute distress.    Appearance: Normal appearance. She is well-developed. She is not ill-appearing.  HENT:     Mouth/Throat:     Mouth: Mucous membranes are moist.  Cardiovascular:     Rate and Rhythm: Normal rate and regular rhythm.     Heart sounds: Normal heart sounds.  Pulmonary:     Effort: Pulmonary effort is normal. No respiratory distress.     Breath sounds: Normal breath sounds.  Musculoskeletal:     Cervical back: Neck  supple.  Skin:    General: Skin is warm and dry.     Findings: Rash present.     Comments: Erythematous patchy papular rash with some linear formations on both thighs; one patchy area on abdomen and one on right forearm.  No open lesions or drainage.   Neurological:     General: No focal deficit present.     Mental Status: She is alert and oriented to person, place, and time.     Gait: Gait normal.  Psychiatric:        Mood and Affect: Mood normal.        Behavior: Behavior normal.      UC Treatments / Results  Labs (all labs ordered are listed, but only abnormal results are displayed) Labs Reviewed - No data to display  EKG   Radiology No results found.  Procedures Procedures (including critical care time)  Medications Ordered in UC Medications - No data to display  Initial Impression / Assessment and Plan / UC Course  I have reviewed the triage vital signs and the nursing notes.  Pertinent labs & imaging results that were available during my care of the patient were reviewed by me and considered in my medical decision making (see chart for details).    Poison ivy dermatitis.  Afebrile and vital signs are stable.  Instructed patient to continue taking Allegra in the mornings and to add Zyrtec or Benadryl at bedtime.  Treating with prednisone taper also.  Instructed her to continue calamine lotion.  Education provided on poison ivy dermatitis.  Instructed patient to follow up with her PCP if her symptoms are not improving.  She agrees to plan of care.    Final Clinical Impressions(s) / UC Diagnoses   Final diagnoses:  Poison ivy dermatitis     Discharge Instructions      Take the prednisone taperas directed.    Take Benadryl or Zyrtec as directed.    Follow up with your primary care  provider.        ED Prescriptions     Medication Sig Dispense Auth. Provider   predniSONE (STERAPRED UNI-PAK 21 TAB) 10 MG (21) TBPK tablet Take by mouth daily. As  directed 21 tablet Mickie Bail, NP      PDMP not reviewed this encounter.   Mickie Bail, NP 08/29/22 [phone]

## 2022-08-29 NOTE — Discharge Instructions (Addendum)
Take the prednisone taper as directed.    Take Benadryl or Zyrtec as directed.    Follow up with your primary care provider.    

## 2022-08-29 NOTE — ED Triage Notes (Signed)
Pt reports a rash all over x 1 week. States the rash started on her legs and has now spread to the stomach and bilateral arms. Believes it could be poison oak/ivy. Has been using calamine lotion, coconut oil, alcohol and OTC ointments with no relief.

## 2023-05-02 ENCOUNTER — Encounter: Payer: Self-pay | Admitting: Family

## 2023-05-02 ENCOUNTER — Ambulatory Visit (INDEPENDENT_AMBULATORY_CARE_PROVIDER_SITE_OTHER): Payer: BC Managed Care – PPO | Admitting: Family

## 2023-05-02 VITALS — BP 121/83 | HR 65 | Ht 66.0 in | Wt 199.8 lb

## 2023-05-02 DIAGNOSIS — E559 Vitamin D deficiency, unspecified: Secondary | ICD-10-CM

## 2023-05-02 DIAGNOSIS — E782 Mixed hyperlipidemia: Secondary | ICD-10-CM | POA: Diagnosis not present

## 2023-05-02 DIAGNOSIS — R7303 Prediabetes: Secondary | ICD-10-CM

## 2023-05-02 DIAGNOSIS — E538 Deficiency of other specified B group vitamins: Secondary | ICD-10-CM | POA: Diagnosis not present

## 2023-05-02 DIAGNOSIS — Z114 Encounter for screening for human immunodeficiency virus [HIV]: Secondary | ICD-10-CM

## 2023-05-02 DIAGNOSIS — E039 Hypothyroidism, unspecified: Secondary | ICD-10-CM | POA: Diagnosis not present

## 2023-05-02 DIAGNOSIS — Z1159 Encounter for screening for other viral diseases: Secondary | ICD-10-CM

## 2023-05-02 DIAGNOSIS — R5383 Other fatigue: Secondary | ICD-10-CM

## 2023-05-02 DIAGNOSIS — Z0130 Encounter for examination of blood pressure without abnormal findings: Secondary | ICD-10-CM

## 2023-05-03 LAB — CBC WITH DIFFERENTIAL/PLATELET
Basophils Absolute: 0.1 10*3/uL (ref 0.0–0.2)
Basos: 1 %
EOS (ABSOLUTE): 0.3 10*3/uL (ref 0.0–0.4)
Eos: 4 %
Hematocrit: 39.2 % (ref 34.0–46.6)
Hemoglobin: 11.9 g/dL (ref 11.1–15.9)
Immature Grans (Abs): 0 10*3/uL (ref 0.0–0.1)
Immature Granulocytes: 0 %
Lymphocytes Absolute: 2.7 10*3/uL (ref 0.7–3.1)
Lymphs: 33 %
MCH: 24.1 pg — ABNORMAL LOW (ref 26.6–33.0)
MCHC: 30.4 g/dL — ABNORMAL LOW (ref 31.5–35.7)
MCV: 79 fL (ref 79–97)
Monocytes Absolute: 0.4 10*3/uL (ref 0.1–0.9)
Monocytes: 4 %
Neutrophils Absolute: 4.9 10*3/uL (ref 1.4–7.0)
Neutrophils: 58 %
Platelets: 245 10*3/uL (ref 150–450)
RBC: 4.94 x10E6/uL (ref 3.77–5.28)
RDW: 13 % (ref 11.7–15.4)
WBC: 8.4 10*3/uL (ref 3.4–10.8)

## 2023-05-03 LAB — LIPID PANEL
Chol/HDL Ratio: 2.7 {ratio} (ref 0.0–4.4)
Cholesterol, Total: 126 mg/dL (ref 100–199)
HDL: 47 mg/dL (ref >39)
LDL Chol Calc (NIH): 65 mg/dL (ref 0–99)
Triglycerides: 64 mg/dL (ref 0–149)
VLDL Cholesterol Cal: 14 mg/dL (ref 5–40)

## 2023-05-03 LAB — CMP14+EGFR
ALT: 18 [IU]/L (ref 0–32)
AST: 20 [IU]/L (ref 0–40)
Albumin: 4.5 g/dL (ref 4.0–5.0)
Alkaline Phosphatase: 134 [IU]/L — ABNORMAL HIGH (ref 44–121)
BUN/Creatinine Ratio: 21 (ref 9–23)
BUN: 16 mg/dL (ref 6–20)
Bilirubin Total: 0.3 mg/dL (ref 0.0–1.2)
CO2: 19 mmol/L — ABNORMAL LOW (ref 20–29)
Calcium: 9.4 mg/dL (ref 8.7–10.2)
Chloride: 105 mmol/L (ref 96–106)
Creatinine, Ser: 0.78 mg/dL (ref 0.57–1.00)
Globulin, Total: 2.8 g/dL (ref 1.5–4.5)
Glucose: 75 mg/dL (ref 70–99)
Potassium: 4 mmol/L (ref 3.5–5.2)
Sodium: 142 mmol/L (ref 134–144)
Total Protein: 7.3 g/dL (ref 6.0–8.5)
eGFR: 110 mL/min/{1.73_m2} (ref >59)

## 2023-05-03 LAB — IRON,TIBC AND FERRITIN PANEL
Ferritin: 26 ng/mL (ref 15–150)
Iron Saturation: 8 % — CL (ref 15–55)
Iron: 32 ug/dL (ref 27–159)
Total Iron Binding Capacity: 377 ug/dL (ref 250–450)
UIBC: 345 ug/dL (ref 131–425)

## 2023-05-03 LAB — HCV INTERPRETATION

## 2023-05-03 LAB — VITAMIN D 25 HYDROXY (VIT D DEFICIENCY, FRACTURES): Vit D, 25-Hydroxy: 23.3 ng/mL — ABNORMAL LOW (ref 30.0–100.0)

## 2023-05-03 LAB — HIV ANTIBODY (ROUTINE TESTING W REFLEX): HIV Screen 4th Generation wRfx: NONREACTIVE

## 2023-05-03 LAB — HCV AB W REFLEX TO QUANT PCR: HCV Ab: NONREACTIVE

## 2023-05-03 LAB — HEMOGLOBIN A1C
Est. average glucose Bld gHb Est-mCnc: 105 mg/dL
Hgb A1c MFr Bld: 5.3 % (ref 4.8–5.6)

## 2023-05-03 LAB — TSH: TSH: 1.85 u[IU]/mL (ref 0.450–4.500)

## 2023-05-03 LAB — VITAMIN B12: Vitamin B-12: 564 pg/mL (ref 232–1245)

## 2023-06-06 ENCOUNTER — Ambulatory Visit: Payer: Self-pay | Admitting: Family

## 2023-06-20 ENCOUNTER — Encounter: Payer: Self-pay | Admitting: Family

## 2023-06-20 ENCOUNTER — Ambulatory Visit (INDEPENDENT_AMBULATORY_CARE_PROVIDER_SITE_OTHER): Payer: 59 | Admitting: Family

## 2023-06-20 VITALS — BP 120/76 | HR 77 | Ht 66.0 in | Wt 206.4 lb

## 2023-06-20 DIAGNOSIS — E559 Vitamin D deficiency, unspecified: Secondary | ICD-10-CM

## 2023-06-20 DIAGNOSIS — Z0130 Encounter for examination of blood pressure without abnormal findings: Secondary | ICD-10-CM

## 2023-06-20 DIAGNOSIS — E611 Iron deficiency: Secondary | ICD-10-CM

## 2023-06-20 NOTE — Progress Notes (Signed)
 Established Patient Office Visit  Subjective:  Patient ID: Cheryl Vega, female    DOB: 10-03-2000  Age: 23 y.o. MRN: [id number]  Chief Complaint  Patient presents with   Follow-up    1 month follow up    Pt. Here today for 1 month n/p follow up.   Had labs done at that time, so we will review in detail today.   Labs: iron was VERY low.   No other concerns today.      No other concerns at this time.   Past Medical History:  Diagnosis Date   Anemia    Calcium oxalate crystals in urine    followed by [facility] nephrology   Dysuria    Environmental and seasonal allergies    Menorrhagia     Past Surgical History:  Procedure Laterality Date   LIPOMA EXCISION N/A 01/04/2021   Procedure: EXCISION LIPOMA;  Surgeon: Dessa Reyes ORN, MD;  Location: [facility] ORS;  Service: General;  Laterality: N/A;  upper back    Social History   Socioeconomic History   Marital status: Single    Spouse name: Not on file   Number of children: Not on file   Years of education: Not on file   Highest education level: Not on file  Occupational History   Not on file  Tobacco Use   Smoking status: Never   Smokeless tobacco: Never  Vaping Use   Vaping status: Never Used  Substance and Sexual Activity   Alcohol use: Never   Drug use: Never   Sexual activity: Never    Birth control/protection: Patch  Other Topics Concern   Not on file  Social History Narrative   Not on file   Social Drivers of Health   Financial Resource Strain: Not on file  Food Insecurity: Not on file  Transportation Needs: Not on file  Physical Activity: Not on file  Stress: Not on file  Social Connections: Not on file  Intimate Partner Violence: Not on file    Family History  Problem Relation Age of Onset   Hypertension Mother    Hypertension Maternal Grandfather     No Known Allergies  Review of Systems  Constitutional:  Positive for malaise/fatigue.  All other systems reviewed and are  negative.      Objective:   BP 120/76   Pulse 77   Ht 5' 6 (1.676 m)   Wt 206 lb 6.4 oz (93.6 kg)   SpO2 99%   BMI 33.31 kg/m   Vitals:   06/20/23 1518  BP: 120/76  Pulse: 77  Height: 5' 6 (1.676 m)  Weight: 206 lb 6.4 oz (93.6 kg)  SpO2: 99%  BMI (Calculated): 33.33    Physical Exam Vitals and nursing note reviewed.  Constitutional:      Appearance: Normal appearance. She is normal weight.  HENT:     Head: Normocephalic.  Eyes:     Extraocular Movements: Extraocular movements intact.     Conjunctiva/sclera: Conjunctivae normal.     Pupils: Pupils are equal, round, and reactive to light.  Cardiovascular:     Rate and Rhythm: Normal rate.  Pulmonary:     Effort: Pulmonary effort is normal.  Neurological:     General: No focal deficit present.     Mental Status: She is alert and oriented to person, place, and time. Mental status is at baseline.  Psychiatric:        Mood and Affect: Mood normal.  Behavior: Behavior normal.        Thought Content: Thought content normal.        Judgment: Judgment normal.      No results found for any visits on 06/20/23.  Recent Results (from the past 2160 hours)  POCT rapid strep A     Status: Normal   Collection Time: 08/15/23  6:21 PM  Result Value Ref Range   Rapid Strep A Screen Negative   POC Covid19/Flu A&B Antigen     Status: Normal   Collection Time: 08/15/23  6:26 PM  Result Value Ref Range   Influenza A Antigen, POC Negative    Influenza B Antigen, POC Negative    Covid Antigen, POC Negative        Assessment & Plan:   Problem List Items Addressed This Visit       Other   Vitamin D  deficiency, unspecified - Primary   Suggested patient pick up Vitamin D  supplement. Will recheck level at follow up appointment.         Iron deficiency   Patient has started iron supplementation.  Will recheck levels at follow up.          Return in about 2 months (around 08/18/2023).   Total time spent:  20 minutes  ALAN CHRISTELLA ARRANT, FNP  06/20/2023   This document may have been prepared by [facility] Voice Recognition software and as such may include unintentional dictation errors.

## 2023-07-30 NOTE — Progress Notes (Signed)
 New Patient Office Visit  Subjective  Patient ID: Cheryl Vega, female    DOB: 2001/01/08  Age: 23 y.o. MRN: [id number]  CC:  Chief Complaint  Patient presents with   Follow-up    Est care    HPI Cheryl Vega presents to establish care  she does have additional concerns to discuss today.   Patient reports some severe fatigue.  She asks if we can check some labs.   No other concerns at this time.     Outpatient Encounter Medications as of 05/02/2023  Medication Sig   [DISCONTINUED] norelgestromin-ethinyl estradiol Burr Medico) 150-35 MCG/24HR transdermal patch Place 1 patch onto the skin once a week.   [DISCONTINUED] ferrous sulfate 325 (65 FE) MG tablet Take 325 mg by mouth daily. (Patient not taking: Reported on 05/02/2023)   [DISCONTINUED] fexofenadine (ALLEGRA) 180 MG tablet Take 180 mg by mouth daily as needed for allergies or rhinitis. (Patient not taking: Reported on 05/02/2023)   [DISCONTINUED] predniSONE (STERAPRED UNI-PAK 21 TAB) 10 MG (21) TBPK tablet Take by mouth daily. As directed (Patient not taking: Reported on 05/02/2023)   [DISCONTINUED] Burr Medico 150-35 MCG/24HR transdermal patch Place 1 patch onto the skin once a week. (Patient not taking: Reported on 05/02/2023)   No facility-administered encounter medications on file as of 05/02/2023.    Past Medical History:  Diagnosis Date   Anemia    Calcium oxalate crystals in urine    followed by [facility] nephrology   Dysuria    Environmental and seasonal allergies    Menorrhagia     Past Surgical History:  Procedure Laterality Date   LIPOMA EXCISION N/A 01/04/2021   Procedure: EXCISION LIPOMA;  Surgeon: Earline Mayotte, MD;  Location: [facility] ORS;  Service: General;  Laterality: N/A;  upper back    Family History  Problem Relation Age of Onset   Hypertension Mother    Hypertension Maternal Grandfather     Social History   Socioeconomic History   Marital status: Single    Spouse name: Not on file    Number of children: Not on file   Years of education: Not on file   Highest education level: Not on file  Occupational History   Not on file  Tobacco Use   Smoking status: Never   Smokeless tobacco: Never  Vaping Use   Vaping status: Never Used  Substance and Sexual Activity   Alcohol use: Never   Drug use: Never   Sexual activity: Never    Birth control/protection: Patch  Other Topics Concern   Not on file  Social History Narrative   Not on file   Social Drivers of Health   Financial Resource Strain: Not on file  Food Insecurity: Not on file  Transportation Needs: Not on file  Physical Activity: Not on file  Stress: Not on file  Social Connections: Not on file  Intimate Partner Violence: Not on file    Review of Systems  Constitutional:  Positive for malaise/fatigue.  All other systems reviewed and are negative.       Objective   BP 121/83   Pulse 65   Ht 5\' 6"  (1.676 m)   Wt 199 lb 12.8 oz (90.6 kg)   LMP 04/14/2023   SpO2 98%   BMI 32.25 kg/m   Physical Exam Vitals and nursing note reviewed.  Constitutional:      Appearance: Normal appearance. She is normal weight.  HENT:     Head: Normocephalic and atraumatic.  Eyes:  Extraocular Movements: Extraocular movements intact.     Conjunctiva/sclera: Conjunctivae normal.     Pupils: Pupils are equal, round, and reactive to light.  Cardiovascular:     Rate and Rhythm: Normal rate and regular rhythm.  Pulmonary:     Effort: Pulmonary effort is normal.  Neurological:     General: No focal deficit present.     Mental Status: She is alert and oriented to person, place, and time. Mental status is at baseline.  Psychiatric:        Mood and Affect: Mood normal.        Behavior: Behavior normal. Behavior is cooperative.        Thought Content: Thought content normal.        Judgment: Judgment normal.        Assessment & Plan:   Problem List Items Addressed This Visit   None Visit Diagnoses        B12 deficiency due to diet    -  Primary   Checking labs today.  Will continue supplements as needed.   Relevant Orders   CMP14+EGFR (Completed)   Vitamin B12 (Completed)   CBC with Diff (Completed)     Mixed hyperlipidemia       Checking labs today.  Continue current therapy for lipid control. Will modify as needed based on labwork results.   Relevant Orders   Lipid panel (Completed)   CMP14+EGFR (Completed)   CBC with Diff (Completed)     Hypothyroidism (acquired)       Test ordered in office today. Will call with results.   Relevant Orders   CMP14+EGFR (Completed)   CBC with Diff (Completed)     Vitamin D deficiency, unspecified       Checking labs today.  Will continue supplements as needed.   Relevant Orders   VITAMIN D 25 Hydroxy (Vit-D Deficiency, Fractures) (Completed)   CMP14+EGFR (Completed)   CBC with Diff (Completed)     Other fatigue       Relevant Orders   CMP14+EGFR (Completed)   TSH (Completed)   CBC with Diff (Completed)   Iron, TIBC and Ferritin Panel (Completed)     Prediabetes       A1C is in prediabetic ranges. Patient counseled on dietary choices and verbalized understanding. Will reassess at follow up after next lab check.   Relevant Orders   CMP14+EGFR (Completed)   Hemoglobin A1c (Completed)   CBC with Diff (Completed)     Screening for HIV without presence of risk factors       Test ordered in office today. Will call with results.   Relevant Orders   HIV antibody (with reflex) (Completed)     Need for hepatitis C screening test       Test ordered in office today. Will call with results.   Relevant Orders   Hepatitis C Ab reflex to Quant PCR (Completed)       Return in about 1 month (around 06/02/2023) for F/U.   Total time spent: 30 minutes  Miki Kins, FNP  05/02/2023  This document may have been prepared by [facility] Voice Recognition software and as such may include unintentional dictation errors.

## 2023-08-15 ENCOUNTER — Ambulatory Visit
Admission: EM | Admit: 2023-08-15 | Discharge: 2023-08-15 | Disposition: A | Discharge status: Home / Self Care | Attending: Emergency Medicine | Admitting: Emergency Medicine

## 2023-08-15 DIAGNOSIS — B349 Viral infection, unspecified: Secondary | ICD-10-CM | POA: Diagnosis not present

## 2023-08-15 LAB — POC COVID19/FLU A&B COMBO
Covid Antigen, POC: NEGATIVE
Influenza A Antigen, POC: NEGATIVE
Influenza B Antigen, POC: NEGATIVE

## 2023-08-15 LAB — POCT RAPID STREP A (OFFICE): Rapid Strep A Screen: NEGATIVE

## 2023-08-15 NOTE — ED Triage Notes (Signed)
 Pt c/o cough,sore throat,congestion & fatigue x1 day. Has tried sudafed w/o relief.

## 2023-08-15 NOTE — Discharge Instructions (Addendum)
 The strep, COVID and flu tests are negative.   Take Tylenol or ibuprofen as needed for fever or discomfort.  Take plain Mucinex as needed for congestion.  Rest and keep yourself hydrated.    Follow-up with your primary care provider if your symptoms are not improving.

## 2023-08-15 NOTE — ED Provider Notes (Signed)
 [name]    CSN: [id number] Arrival date & time: 08/15/23  1737      History   Chief Complaint Chief Complaint  Patient presents with   Sore Throat   Cough   Nasal Congestion    HPI MONIFA BLANCHETTE is a 23 y.o. female.  Patient presents with congestion, sore throat, cough, fatigue since yesterday.  She has been treating her symptoms with Sudafed.  No fever, shortness of breath, vomiting, diarrhea.  The history is provided by the patient and medical records.    Past Medical History:  Diagnosis Date   Anemia    Calcium oxalate crystals in urine    followed by [facility] nephrology   Dysuria    Environmental and seasonal allergies    Menorrhagia     There are no active problems to display for this patient.   Past Surgical History:  Procedure Laterality Date   LIPOMA EXCISION N/A 01/04/2021   Procedure: EXCISION LIPOMA;  Surgeon: Earline Mayotte, MD;  Location: [facility] ORS;  Service: General;  Laterality: N/A;  upper back    OB History     Gravida  0   Para  0   Term  0   Preterm  0   AB  0   Living  0      SAB  0   IAB  0   Ectopic  0   Multiple  0   Live Births  0            Home Medications    Prior to Admission medications   Medication Sig Start Date End Date Taking? Authorizing Provider  ferrous sulfate 325 (65 FE) MG tablet Take 325 mg by mouth daily with breakfast.   Yes [provider]    Family History Family History  Problem Relation Age of Onset   Hypertension Mother    Hypertension Maternal Grandfather     Social History Social History   Tobacco Use   Smoking status: Never   Smokeless tobacco: Never  Vaping Use   Vaping status: Never Used  Substance Use Topics   Alcohol use: Never   Drug use: Never     Allergies   Patient has no known allergies.   Review of Systems Review of Systems  Constitutional:  Positive for fatigue. Negative for chills.  HENT:  Positive for congestion and sore  throat. Negative for ear pain.   Respiratory:  Positive for cough. Negative for shortness of breath.      Physical Exam Triage Vital Signs ED Triage Vitals [08/15/23 1800]  Encounter Vitals Group     BP      Systolic BP Percentile      Diastolic BP Percentile      Pulse      Resp 16     Temp      Temp Source Oral     SpO2      Weight      Height      Head Circumference      Peak Flow      Pain Score      Pain Loc      Pain Education      Exclude from Growth Chart    No data found.  Updated Vital Signs BP 130/87 (BP Location: Left Arm)   Pulse 64   Temp 98.6 F (37 C) (Oral)   Resp 16   Ht 5\' 7"  (1.702 m)   Wt 210 lb (  95.3 kg)   LMP 08/04/2023 (Exact Date)   SpO2 99%   BMI 32.89 kg/m   Visual Acuity Right Eye Distance:   Left Eye Distance:   Bilateral Distance:    Right Eye Near:   Left Eye Near:    Bilateral Near:     Physical Exam Constitutional:      General: She is not in acute distress. HENT:     Right Ear: Tympanic membrane normal.     Left Ear: Tympanic membrane normal.     Nose: Congestion and rhinorrhea present.     Mouth/Throat:     Mouth: Mucous membranes are moist.     Pharynx: Oropharynx is clear.  Cardiovascular:     Rate and Rhythm: Normal rate and regular rhythm.     Heart sounds: Normal heart sounds.  Pulmonary:     Effort: Pulmonary effort is normal. No respiratory distress.     Breath sounds: Normal breath sounds.  Abdominal:     General: Bowel sounds are normal.     Palpations: Abdomen is soft.     Tenderness: There is no abdominal tenderness. There is no guarding or rebound.  Neurological:     Mental Status: She is alert.      UC Treatments / Results  Labs (all labs ordered are listed, but only abnormal results are displayed) Labs Reviewed  POC COVID19/FLU A&B COMBO - Normal  POCT RAPID STREP A (OFFICE) - Normal    EKG   Radiology No results found.  Procedures Procedures (including critical care  time)  Medications Ordered in UC Medications - No data to display  Initial Impression / Assessment and Plan / UC Course  I have reviewed the triage vital signs and the nursing notes.  Pertinent labs & imaging results that were available during my care of the patient were reviewed by me and considered in my medical decision making (see chart for details).    Viral illness. Rapid strep negative.  Rapid COVID and flu negative.  Discussed symptomatic treatment including Tylenol or ibuprofen as needed for fever or discomfort, plain Mucinex as needed for congestion, rest, hydration.  Instructed patient to follow-up with her PCP if not improving.  ED precautions given.  Patient agrees to plan of care.  Final Clinical Impressions(s) / UC Diagnoses   Final diagnoses:  Viral illness     Discharge Instructions      The strep, COVID and flu tests are negative.   Take Tylenol or ibuprofen as needed for fever or discomfort.  Take plain Mucinex as needed for congestion.  Rest and keep yourself hydrated.    Follow-up with your primary care provider if your symptoms are not improving.         ED Prescriptions   None    PDMP not reviewed this encounter.   Mickie Bail, NP 08/15/23 [phone]

## 2023-08-19 DIAGNOSIS — E559 Vitamin D deficiency, unspecified: Secondary | ICD-10-CM | POA: Insufficient documentation

## 2023-08-19 DIAGNOSIS — E611 Iron deficiency: Secondary | ICD-10-CM | POA: Insufficient documentation

## 2023-08-19 NOTE — Assessment & Plan Note (Signed)
 Patient has started iron supplementation.  Will recheck levels at follow up.

## 2023-08-19 NOTE — Assessment & Plan Note (Signed)
 Suggested patient pick up Vitamin D supplement. Will recheck level at follow up appointment.

## 2023-08-22 ENCOUNTER — Encounter: Payer: Self-pay | Admitting: Family

## 2023-08-22 ENCOUNTER — Ambulatory Visit (INDEPENDENT_AMBULATORY_CARE_PROVIDER_SITE_OTHER): Payer: 59 | Admitting: Family

## 2023-08-22 VITALS — BP 110/78 | HR 96 | Ht 66.0 in | Wt 207.8 lb

## 2023-08-22 DIAGNOSIS — E782 Mixed hyperlipidemia: Secondary | ICD-10-CM

## 2023-08-22 DIAGNOSIS — E559 Vitamin D deficiency, unspecified: Secondary | ICD-10-CM

## 2023-08-22 DIAGNOSIS — E611 Iron deficiency: Secondary | ICD-10-CM

## 2023-08-22 DIAGNOSIS — Z1389 Encounter for screening for other disorder: Secondary | ICD-10-CM

## 2023-08-22 DIAGNOSIS — Z0001 Encounter for general adult medical examination with abnormal findings: Secondary | ICD-10-CM | POA: Diagnosis not present

## 2023-08-22 DIAGNOSIS — R300 Dysuria: Secondary | ICD-10-CM

## 2023-08-22 DIAGNOSIS — E538 Deficiency of other specified B group vitamins: Secondary | ICD-10-CM

## 2023-08-22 DIAGNOSIS — Z1272 Encounter for screening for malignant neoplasm of vagina: Secondary | ICD-10-CM

## 2023-08-22 DIAGNOSIS — R7303 Prediabetes: Secondary | ICD-10-CM

## 2023-08-22 DIAGNOSIS — Z0000 Encounter for general adult medical examination without abnormal findings: Secondary | ICD-10-CM

## 2023-08-22 DIAGNOSIS — Z0130 Encounter for examination of blood pressure without abnormal findings: Secondary | ICD-10-CM

## 2023-08-22 DIAGNOSIS — R5383 Other fatigue: Secondary | ICD-10-CM

## 2023-08-22 LAB — POCT URINALYSIS DIPSTICK
Bilirubin, UA: NEGATIVE
Glucose, UA: NEGATIVE
Ketones, UA: NEGATIVE
Nitrite, UA: NEGATIVE
Protein, UA: NEGATIVE
Spec Grav, UA: 1.02 (ref 1.010–1.025)
Urobilinogen, UA: 0.2 U/dL
pH, UA: 5.5 (ref 5.0–8.0)

## 2023-08-23 LAB — CBC WITH DIFFERENTIAL/PLATELET
Basophils Absolute: 0.1 10*3/uL (ref 0.0–0.2)
Basos: 1 %
EOS (ABSOLUTE): 0.4 10*3/uL (ref 0.0–0.4)
Eos: 4 %
Hematocrit: 38.9 % (ref 34.0–46.6)
Hemoglobin: 12.5 g/dL (ref 11.1–15.9)
Immature Grans (Abs): 0 10*3/uL (ref 0.0–0.1)
Immature Granulocytes: 0 %
Lymphocytes Absolute: 2.4 10*3/uL (ref 0.7–3.1)
Lymphs: 23 %
MCH: 25.6 pg — ABNORMAL LOW (ref 26.6–33.0)
MCHC: 32.1 g/dL (ref 31.5–35.7)
MCV: 80 fL (ref 79–97)
Monocytes Absolute: 0.6 10*3/uL (ref 0.1–0.9)
Monocytes: 6 %
Neutrophils Absolute: 7 10*3/uL (ref 1.4–7.0)
Neutrophils: 66 %
Platelets: 245 10*3/uL (ref 150–450)
RBC: 4.88 x10E6/uL (ref 3.77–5.28)
RDW: 13.5 % (ref 11.7–15.4)
WBC: 10.5 10*3/uL (ref 3.4–10.8)

## 2023-08-23 LAB — CMP14+EGFR
ALT: 17 IU/L (ref 0–32)
AST: 21 IU/L (ref 0–40)
Albumin: 4.5 g/dL (ref 4.0–5.0)
Alkaline Phosphatase: 124 IU/L — ABNORMAL HIGH (ref 44–121)
BUN/Creatinine Ratio: 14 (ref 9–23)
BUN: 11 mg/dL (ref 6–20)
Bilirubin Total: 0.4 mg/dL (ref 0.0–1.2)
CO2: 23 mmol/L (ref 20–29)
Calcium: 9.7 mg/dL (ref 8.7–10.2)
Chloride: 102 mmol/L (ref 96–106)
Creatinine, Ser: 0.76 mg/dL (ref 0.57–1.00)
Globulin, Total: 2.7 g/dL (ref 1.5–4.5)
Glucose: 87 mg/dL (ref 70–99)
Potassium: 4 mmol/L (ref 3.5–5.2)
Sodium: 139 mmol/L (ref 134–144)
Total Protein: 7.2 g/dL (ref 6.0–8.5)
eGFR: 114 mL/min/{1.73_m2} (ref >59)

## 2023-08-23 LAB — LIPID PANEL
Chol/HDL Ratio: 3 ratio (ref 0.0–4.4)
Cholesterol, Total: 124 mg/dL (ref 100–199)
HDL: 41 mg/dL (ref >39)
LDL Chol Calc (NIH): 70 mg/dL (ref 0–99)
Triglycerides: 60 mg/dL (ref 0–149)
VLDL Cholesterol Cal: 13 mg/dL (ref 5–40)

## 2023-08-23 LAB — MICROSCOPIC EXAMINATION
Bacteria, UA: NONE SEEN
Casts: NONE SEEN /LPF
RBC, Urine: NONE SEEN /HPF (ref 0–2)

## 2023-08-23 LAB — URINALYSIS, ROUTINE W REFLEX MICROSCOPIC
Bilirubin, UA: NEGATIVE
Glucose, UA: NEGATIVE
Ketones, UA: NEGATIVE
Nitrite, UA: NEGATIVE
Protein,UA: NEGATIVE
RBC, UA: NEGATIVE
Specific Gravity, UA: 1.017 (ref 1.005–1.030)
Urobilinogen, Ur: 0.2 mg/dL (ref 0.2–1.0)
pH, UA: 5.5 (ref 5.0–7.5)

## 2023-08-23 LAB — IRON,TIBC AND FERRITIN PANEL
Ferritin: 30 ng/mL (ref 15–150)
Iron Saturation: 8 % — CL (ref 15–55)
Iron: 29 ug/dL (ref 27–159)
Total Iron Binding Capacity: 371 ug/dL (ref 250–450)
UIBC: 342 ug/dL (ref 131–425)

## 2023-08-23 LAB — TSH: TSH: 2.02 u[IU]/mL (ref 0.450–4.500)

## 2023-08-23 LAB — HEMOGLOBIN A1C
Est. average glucose Bld gHb Est-mCnc: 100 mg/dL
Hgb A1c MFr Bld: 5.1 % (ref 4.8–5.6)

## 2023-08-23 LAB — URINE CULTURE

## 2023-08-23 LAB — VITAMIN B12: Vitamin B-12: 555 pg/mL (ref 232–1245)

## 2023-08-23 LAB — VITAMIN D 25 HYDROXY (VIT D DEFICIENCY, FRACTURES): Vit D, 25-Hydroxy: 23.3 ng/mL — ABNORMAL LOW (ref 30.0–100.0)

## 2023-08-25 NOTE — Progress Notes (Signed)
 Patient notified

## 2023-09-06 LAB — PAP IG (IMAGE GUIDED): PAP Smear Comment: 0

## 2023-11-18 NOTE — Assessment & Plan Note (Signed)
 Checking labs today.  Will continue supplements as needed.   - Vitamin D  - Vitamin B12 - TSH

## 2023-11-18 NOTE — Progress Notes (Signed)
 Complete physical exam  Patient: Cheryl Vega   DOB: 2001/02/23   22 y.o. Female  MRN: [id number]  Subjective:    Chief Complaint  Patient presents with   Annual Exam    CPE and PAP    Cheryl Vega is a 23 y.o. female who presents today for a complete physical exam. She reports consuming a general diet.  She generally feels well. She reports sleeping fairly well. She does not have additional problems to discuss today.     Most recent depression screenings:    06/20/2023    4:23 PM  PHQ 2/9 Scores  PHQ - 2 Score 1    Past Medical History:  Diagnosis Date   Anemia    Calcium oxalate crystals in urine    followed by [facility] nephrology   Dysuria    Environmental and seasonal allergies    Menorrhagia     Past Surgical History:  Procedure Laterality Date   LIPOMA EXCISION N/A 01/04/2021   Procedure: EXCISION LIPOMA;  Surgeon: Dessa Reyes ORN, MD;  Location: [facility] ORS;  Service: General;  Laterality: N/A;  upper back    Family History  Problem Relation Age of Onset   Hypertension Mother    Hypertension Maternal Grandfather     Social History   Socioeconomic History   Marital status: Single    Spouse name: Not on file   Number of children: Not on file   Years of education: Not on file   Highest education level: Not on file  Occupational History   Not on file  Tobacco Use   Smoking status: Never   Smokeless tobacco: Never  Vaping Use   Vaping status: Never Used  Substance and Sexual Activity   Alcohol use: Never   Drug use: Never   Sexual activity: Never    Birth control/protection: Patch  Other Topics Concern   Not on file  Social History Narrative   Not on file   Social Drivers of Health   Financial Resource Strain: Not on file  Food Insecurity: Not on file  Transportation Needs: Not on file  Physical Activity: Not on file  Stress: Not on file  Social Connections: Not on file  Intimate Partner Violence: Not on file    Outpatient Medications  Prior to Visit  Medication Sig   ferrous sulfate 325 (65 FE) MG tablet Take 325 mg by mouth daily with breakfast.   No facility-administered medications prior to visit.    Review of Systems  All other systems reviewed and are negative.       Objective:     BP 110/78   Pulse 96   Ht 5' 6 (1.676 m)   Wt 207 lb 12.8 oz (94.3 kg)   LMP 08/04/2023 (Exact Date)   SpO2 99%   BMI 33.54 kg/m    Physical Exam Vitals and nursing note reviewed.  Constitutional:      Appearance: Normal appearance. She is normal weight.  HENT:     Head: Normocephalic and atraumatic.  Eyes:     Extraocular Movements: Extraocular movements intact.     Conjunctiva/sclera: Conjunctivae normal.     Pupils: Pupils are equal, round, and reactive to light.  Cardiovascular:     Rate and Rhythm: Normal rate and regular rhythm.  Pulmonary:     Effort: Pulmonary effort is normal.  Musculoskeletal:        General: Normal range of motion.     Cervical back: Normal range of motion.  Neurological:     General: No focal deficit present.     Mental Status: She is alert and oriented to person, place, and time. Mental status is at baseline.  Psychiatric:        Mood and Affect: Mood normal.        Behavior: Behavior normal.        Thought Content: Thought content normal.        Judgment: Judgment normal.      Results for orders placed or performed in visit on 08/22/23  Urine Culture   Specimen: Urine, Clean Catch   UR  Result Value Ref Range   Urine Culture, Routine Final report    Organism ID, Bacteria Comment   Microscopic Examination  Result Value Ref Range   WBC, UA 0-5 0 - 5 /hpf   RBC, Urine None seen 0 - 2 /hpf   Epithelial Cells (non renal) 0-10 0 - 10 /hpf   Casts None seen None seen /lpf   Bacteria, UA None seen None seen/Few  Lipid panel  Result Value Ref Range   Cholesterol, Total 124 100 - 199 mg/dL   Triglycerides 60 0 - 149 mg/dL   HDL 41 >60 mg/dL   VLDL Cholesterol Cal 13 5 -  40 mg/dL   LDL Chol Calc (NIH) 70 0 - 99 mg/dL   Chol/HDL Ratio 3.0 0.0 - 4.4 ratio  VITAMIN D  25 Hydroxy (Vit-D Deficiency, Fractures)  Result Value Ref Range   Vit D, 25-Hydroxy 23.3 (L) 30.0 - 100.0 ng/mL  CMP14+EGFR  Result Value Ref Range   Glucose 87 70 - 99 mg/dL   BUN 11 6 - 20 mg/dL   Creatinine, Ser 9.23 0.57 - 1.00 mg/dL   eGFR 885 >40 fO/fpw/8.26   BUN/Creatinine Ratio 14 9 - 23   Sodium 139 134 - 144 mmol/L   Potassium 4.0 3.5 - 5.2 mmol/L   Chloride 102 96 - 106 mmol/L   CO2 23 20 - 29 mmol/L   Calcium 9.7 8.7 - 10.2 mg/dL   Total Protein 7.2 6.0 - 8.5 g/dL   Albumin 4.5 4.0 - 5.0 g/dL   Globulin, Total 2.7 1.5 - 4.5 g/dL   Bilirubin Total 0.4 0.0 - 1.2 mg/dL   Alkaline Phosphatase 124 (H) 44 - 121 IU/L   AST 21 0 - 40 IU/L   ALT 17 0 - 32 IU/L  TSH  Result Value Ref Range   TSH 2.020 0.450 - 4.500 uIU/mL  Hemoglobin A1c  Result Value Ref Range   Hgb A1c MFr Bld 5.1 4.8 - 5.6 %   Est. average glucose Bld gHb Est-mCnc 100 mg/dL  Vitamin A87  Result Value Ref Range   Vitamin B-12 555 232 - 1,245 pg/mL  CBC with Diff  Result Value Ref Range   WBC 10.5 3.4 - 10.8 x10E3/uL   RBC 4.88 3.77 - 5.28 x10E6/uL   Hemoglobin 12.5 11.1 - 15.9 g/dL   Hematocrit 61.0 65.9 - 46.6 %   MCV 80 79 - 97 fL   MCH 25.6 (L) 26.6 - 33.0 pg   MCHC 32.1 31.5 - 35.7 g/dL   RDW 86.4 88.2 - 84.5 %   Platelets 245 150 - 450 x10E3/uL   Neutrophils 66 Not Estab. %   Lymphs 23 Not Estab. %   Monocytes 6 Not Estab. %   Eos 4 Not Estab. %   Basos 1 Not Estab. %   Neutrophils Absolute 7.0 1.4 - 7.0 x10E3/uL  Lymphocytes Absolute 2.4 0.7 - 3.1 x10E3/uL   Monocytes Absolute 0.6 0.1 - 0.9 x10E3/uL   EOS (ABSOLUTE) 0.4 0.0 - 0.4 x10E3/uL   Basophils Absolute 0.1 0.0 - 0.2 x10E3/uL   Immature Granulocytes 0 Not Estab. %   Immature Grans (Abs) 0.0 0.0 - 0.1 x10E3/uL  Iron, TIBC and Ferritin Panel  Result Value Ref Range   Total Iron Binding Capacity 371 250 - 450 ug/dL   UIBC 657 868  - 574 ug/dL   Iron 29 27 - 840 ug/dL   Iron Saturation 8 (LL) 15 - 55 %   Ferritin 30 15 - 150 ng/mL  Urinalysis, Routine w reflex microscopic  Result Value Ref Range   Specific Gravity, UA 1.017 1.005 - 1.030   pH, UA 5.5 5.0 - 7.5   Color, UA Yellow Yellow   Appearance Ur Clear Clear   Leukocytes,UA 2+ (A) Negative   Protein,UA Negative Negative/Trace   Glucose, UA Negative Negative   Ketones, UA Negative Negative   RBC, UA Negative Negative   Bilirubin, UA Negative Negative   Urobilinogen, Ur 0.2 0.2 - 1.0 mg/dL   Nitrite, UA Negative Negative   Microscopic Examination See below:   POCT Urinalysis Dipstick (18997)  Result Value Ref Range   Color, UA Yellow    Clarity, UA Clear    Glucose, UA Negative Negative   Bilirubin, UA Negative    Ketones, UA Negative    Spec Grav, UA 1.020 1.010 - 1.025   Blood, UA Trace    pH, UA 5.5 5.0 - 8.0   Protein, UA Negative Negative   Urobilinogen, UA 0.2 0.2 or 1.0 E.U./dL   Nitrite, UA Negative    Leukocytes, UA Small (1+) (A) Negative   Appearance Clear    Odor Yes   Pap IG (Image Guided)  Result Value Ref Range   DIAGNOSIS: Comment (A)    Specimen adequacy: Comment    Clinician Provided ICD10 Comment    Performed by: Comment    Electronically signed by: Comment    PAP Smear Comment .    PATHOLOGIST PROVIDED ICD10: Comment    Note: Comment    Test Methodology Comment     Recent Results (from the past 2160 hours)  POCT Urinalysis Dipstick (18997)     Status: Abnormal   Collection Time: 08/22/23  3:23 PM  Result Value Ref Range   Color, UA Yellow    Clarity, UA Clear    Glucose, UA Negative Negative   Bilirubin, UA Negative    Ketones, UA Negative    Spec Grav, UA 1.020 1.010 - 1.025   Blood, UA Trace    pH, UA 5.5 5.0 - 8.0   Protein, UA Negative Negative   Urobilinogen, UA 0.2 0.2 or 1.0 E.U./dL   Nitrite, UA Negative    Leukocytes, UA Small (1+) (A) Negative   Appearance Clear    Odor Yes   Lipid panel      Status: None   Collection Time: 08/22/23  3:39 PM  Result Value Ref Range   Cholesterol, Total 124 100 - 199 mg/dL   Triglycerides 60 0 - 149 mg/dL   HDL 41 >60 mg/dL   VLDL Cholesterol Cal 13 5 - 40 mg/dL   LDL Chol Calc (NIH) 70 0 - 99 mg/dL   Chol/HDL Ratio 3.0 0.0 - 4.4 ratio    Comment:  T. Chol/HDL Ratio                                             Men  Women                               1/2 Avg.Risk  3.4    3.3                                   Avg.Risk  5.0    4.4                                2X Avg.Risk  9.6    7.1                                3X Avg.Risk 23.4   11.0   VITAMIN D  25 Hydroxy (Vit-D Deficiency, Fractures)     Status: Abnormal   Collection Time: 08/22/23  3:39 PM  Result Value Ref Range   Vit D, 25-Hydroxy 23.3 (L) 30.0 - 100.0 ng/mL    Comment: Vitamin D  deficiency has been defined by the Institute of Medicine and an Endocrine Society practice guideline as a level of serum 25-OH vitamin D  less than 20 ng/mL (1,2). The Endocrine Society went on to further define vitamin D  insufficiency as a level between 21 and 29 ng/mL (2). 1. IOM (Institute of Medicine). 2010. Dietary reference    intakes for calcium and D. [place]: The    [employer]. 2. Holick MF, Binkley [place], Bischoff-Ferrari HA, et al.    Evaluation, treatment, and prevention of vitamin D     deficiency: an Endocrine Society clinical practice    guideline. JCEM. 2011 Jul; 96(7):1911-30.   CMP14+EGFR     Status: Abnormal   Collection Time: 08/22/23  3:39 PM  Result Value Ref Range   Glucose 87 70 - 99 mg/dL   BUN 11 6 - 20 mg/dL   Creatinine, Ser 9.23 0.57 - 1.00 mg/dL   eGFR 885 >40 fO/fpw/8.26   BUN/Creatinine Ratio 14 9 - 23   Sodium 139 134 - 144 mmol/L   Potassium 4.0 3.5 - 5.2 mmol/L   Chloride 102 96 - 106 mmol/L   CO2 23 20 - 29 mmol/L   Calcium 9.7 8.7 - 10.2 mg/dL   Total Protein 7.2 6.0 - 8.5 g/dL   Albumin 4.5 4.0 - 5.0 g/dL    Globulin, Total 2.7 1.5 - 4.5 g/dL   Bilirubin Total 0.4 0.0 - 1.2 mg/dL   Alkaline Phosphatase 124 (H) 44 - 121 IU/L   AST 21 0 - 40 IU/L   ALT 17 0 - 32 IU/L  TSH     Status: None   Collection Time: 08/22/23  3:39 PM  Result Value Ref Range   TSH 2.020 0.450 - 4.500 uIU/mL  Hemoglobin A1c     Status: None   Collection Time: 08/22/23  3:39 PM  Result Value Ref Range   Hgb A1c MFr Bld 5.1 4.8 - 5.6 %    Comment:          Prediabetes: 5.7 -  6.4          Diabetes: >6.4          Glycemic control for adults with diabetes: <7.0    Est. average glucose Bld gHb Est-mCnc 100 mg/dL  Vitamin B12     Status: None   Collection Time: 08/22/23  3:39 PM  Result Value Ref Range   Vitamin B-12 555 232 - 1,245 pg/mL  CBC with Diff     Status: Abnormal   Collection Time: 08/22/23  3:39 PM  Result Value Ref Range   WBC 10.5 3.4 - 10.8 x10E3/uL   RBC 4.88 3.77 - 5.28 x10E6/uL   Hemoglobin 12.5 11.1 - 15.9 g/dL   Hematocrit 61.0 65.9 - 46.6 %   MCV 80 79 - 97 fL   MCH 25.6 (L) 26.6 - 33.0 pg   MCHC 32.1 31.5 - 35.7 g/dL   RDW 86.4 88.2 - 84.5 %   Platelets 245 150 - 450 x10E3/uL   Neutrophils 66 Not Estab. %   Lymphs 23 Not Estab. %   Monocytes 6 Not Estab. %   Eos 4 Not Estab. %   Basos 1 Not Estab. %   Neutrophils Absolute 7.0 1.4 - 7.0 x10E3/uL   Lymphocytes Absolute 2.4 0.7 - 3.1 x10E3/uL   Monocytes Absolute 0.6 0.1 - 0.9 x10E3/uL   EOS (ABSOLUTE) 0.4 0.0 - 0.4 x10E3/uL   Basophils Absolute 0.1 0.0 - 0.2 x10E3/uL   Immature Granulocytes 0 Not Estab. %   Immature Grans (Abs) 0.0 0.0 - 0.1 x10E3/uL  Iron, TIBC and Ferritin Panel     Status: Abnormal   Collection Time: 08/22/23  3:39 PM  Result Value Ref Range   Total Iron Binding Capacity 371 250 - 450 ug/dL   UIBC 657 868 - 574 ug/dL   Iron 29 27 - 840 ug/dL   Iron Saturation 8 (LL) 15 - 55 %   Ferritin 30 15 - 150 ng/mL  Pap IG (Image Guided)     Status: Abnormal   Collection Time: 08/22/23  4:03 PM  Result Value Ref Range    DIAGNOSIS: Comment (A)     Comment: EPITHELIAL CELL ABNORMALITY. ATYPICAL SQUAMOUS CELLS OF UNDETERMINED SIGNIFICANCE (ASC-US ).    Specimen adequacy: Comment     Comment: Satisfactory for evaluation. Endocervical and/or squamous metaplastic cells (endocervical component) are present.    Clinician Provided ICD10 Comment     Comment: Z12.72   Performed by: Comment     Comment: Jon Hamilton, Supervisory Cytologist (ASCP)   Electronically signed by: Comment     Comment: [place] DELENA Blanch, MD, Pathologist   PAP Smear Comment .    PATHOLOGIST PROVIDED ICD10: Comment     Comment: R87.610   Note: Comment     Comment: The Pap smear is a screening test designed to aid in the detection of premalignant and malignant conditions of the uterine cervix.  It is not a diagnostic procedure and should not be used as the sole means of detecting cervical cancer.  Both false-positive and false-negative reports do occur.    Test Methodology Comment     Comment: This liquid based ThinPrep(R) pap test was interpreted using the Hologic(R) Genius(TM) Cervical AI Algorithm whole slide imaging system.   Urinalysis, Routine w reflex microscopic     Status: Abnormal   Collection Time: 08/22/23  4:06 PM  Result Value Ref Range   Specific Gravity, UA 1.017 1.005 - 1.030   pH, UA 5.5 5.0 - 7.5   Color,  UA Yellow Yellow   Appearance Ur Clear Clear   Leukocytes,UA 2+ (A) Negative   Protein,UA Negative Negative/Trace   Glucose, UA Negative Negative   Ketones, UA Negative Negative   RBC, UA Negative Negative   Bilirubin, UA Negative Negative   Urobilinogen, Ur 0.2 0.2 - 1.0 mg/dL   Nitrite, UA Negative Negative   Microscopic Examination See below:     Comment: Microscopic was indicated and was performed.  Microscopic Examination     Status: None   Collection Time: 08/22/23  4:06 PM  Result Value Ref Range   WBC, UA 0-5 0 - 5 /hpf   RBC, Urine None seen 0 - 2 /hpf   Epithelial Cells (non renal) 0-10 0 - 10  /hpf   Casts None seen None seen /lpf   Bacteria, UA None seen None seen/Few  Urine Culture     Status: None   Collection Time: 08/22/23  4:08 PM   Specimen: Urine, Clean Catch   UR  Result Value Ref Range   Urine Culture, Routine Final report    Organism ID, Bacteria Comment     Comment: Mixed urogenital flora Less than 10,000 colonies/mL         Assessment & Plan:    Routine Health Maintenance and Physical Exam  Immunization History  Administered Date(s) Administered   Influenza,inj,Quad PF,6+ Mos 03/18/2019   Influenza-Unspecified 03/18/2019    Health Maintenance  Topic Date Due   HPV VACCINES (1 - 3-dose series) Never done   Meningococcal B Vaccine (1 of 2 - Standard) Never done   DTaP/Tdap/Td (1 - Tdap) Never done   Hepatitis B Vaccines (1 of 3 - 19+ 3-dose series) Never done   COVID-19 Vaccine (1 - 2024-25 season) Never done   INFLUENZA VACCINE  12/15/2023   Cervical Cancer Screening (Pap smear)  08/22/2026   Hepatitis C Screening  Completed   HIV Screening  Completed    Discussed health benefits of physical activity, and encouraged her to engage in regular exercise appropriate for her age and condition.  Assessment & Plan Vaginal Pap smear Pap smear obtained in office today.  Will call pt with results when available.  Screening for blood or protein in urine Dysuria Urinalysis in office today WNL.  She will let me know if any further symptoms.   Iron deficiency Other fatigue Vitamin D  deficiency, unspecified B12 deficiency due to diet Checking labs today.  Will continue supplements as needed.   - Vitamin D  - Vitamin B12 - TSH  Mixed hyperlipidemia Checking labs today.  Continue current therapy for lipid control. Will modify as needed based on labwork results.   -CMP w/eGFR -Lipid Panel  Prediabetes A1C Continues to be in prediabetic ranges.  Will reassess at follow up after next lab check.  Patient counseled on dietary choices and  verbalized understanding.   -CBC w/Diff -CMP w/eGFR -Hemoglobin A1C   Routine general medical examination at a health care facility     No follow-ups on file.     ALAN CHRISTELLA ARRANT, FNP  08/22/2023   This document may have been prepared by [facility] Voice Recognition software and as such may include unintentional dictation errors.

## 2023-11-21 ENCOUNTER — Ambulatory Visit: Admitting: Family

## 2023-12-05 ENCOUNTER — Ambulatory Visit (INDEPENDENT_AMBULATORY_CARE_PROVIDER_SITE_OTHER): Admitting: Family

## 2023-12-05 ENCOUNTER — Encounter: Payer: Self-pay | Admitting: Family

## 2023-12-05 VITALS — BP 122/76 | HR 92 | Ht 66.0 in | Wt 213.0 lb

## 2023-12-05 DIAGNOSIS — R5383 Other fatigue: Secondary | ICD-10-CM

## 2023-12-05 DIAGNOSIS — E669 Obesity, unspecified: Secondary | ICD-10-CM

## 2023-12-05 DIAGNOSIS — E611 Iron deficiency: Secondary | ICD-10-CM | POA: Diagnosis not present

## 2023-12-05 DIAGNOSIS — E538 Deficiency of other specified B group vitamins: Secondary | ICD-10-CM

## 2023-12-05 DIAGNOSIS — E782 Mixed hyperlipidemia: Secondary | ICD-10-CM

## 2023-12-05 DIAGNOSIS — E559 Vitamin D deficiency, unspecified: Secondary | ICD-10-CM

## 2023-12-05 DIAGNOSIS — R7303 Prediabetes: Secondary | ICD-10-CM

## 2023-12-05 MED ORDER — PHENTERMINE HCL 37.5 MG PO TABS: 37.5000 mg | ORAL_TABLET | Freq: Every day | ORAL | 1 refills | Status: DC

## 2023-12-05 NOTE — Progress Notes (Signed)
 Established Patient Office Visit  Subjective:  Patient ID: Cheryl Vega, female    DOB: [birth year]  Age: 23 y.o. MRN: [id number]  Chief Complaint  Patient presents with   Follow-up    Still having very low energy Asks if there is anything she can try to lose weight.       No other concerns at this time.   Past Medical History:  Diagnosis Date   Anemia    Calcium oxalate crystals in urine    followed by [facility] nephrology   Dysuria    Environmental and seasonal allergies    Menorrhagia     Past Surgical History:  Procedure Laterality Date   LIPOMA EXCISION N/A 01/04/2021   Procedure: EXCISION LIPOMA;  Surgeon: Dessa Reyes ORN, MD;  Location: [facility] ORS;  Service: General;  Laterality: N/A;  upper back    Social History   Socioeconomic History   Marital status: Single    Spouse name: Not on file   Number of children: Not on file   Years of education: Not on file   Highest education level: Not on file  Occupational History   Not on file  Tobacco Use   Smoking status: Never   Smokeless tobacco: Never  Vaping Use   Vaping status: Never Used  Substance and Sexual Activity   Alcohol use: Never   Drug use: Never   Sexual activity: Never    Birth control/protection: Patch  Other Topics Concern   Not on file  Social History Narrative   Not on file   Social Drivers of Health   Financial Resource Strain: Not on file  Food Insecurity: Not on file  Transportation Needs: Not on file  Physical Activity: Not on file  Stress: Not on file  Social Connections: Not on file  Intimate Partner Violence: Not on file    Family History  Problem Relation Age of Onset   Hypertension Mother    Hypertension Maternal Grandfather     No Known Allergies  Review of Systems  All other systems reviewed and are negative.      Objective:   BP 122/76   Pulse 92   Ht 5' 6 (1.676 m)   Wt 213 lb (96.6 kg)   SpO2 99%   BMI 34.38 kg/m   Vitals:   12/05/23 1507   BP: 122/76  Pulse: 92  Height: 5' 6 (1.676 m)  Weight: 213 lb (96.6 kg)  SpO2: 99%  BMI (Calculated): 34.4    Physical Exam Vitals and nursing note reviewed.  Constitutional:      Appearance: Normal appearance. She is normal weight.  HENT:     Head: Normocephalic.  Eyes:     Extraocular Movements: Extraocular movements intact.     Conjunctiva/sclera: Conjunctivae normal.     Pupils: Pupils are equal, round, and reactive to light.  Cardiovascular:     Rate and Rhythm: Normal rate.  Pulmonary:     Effort: Pulmonary effort is normal.  Neurological:     General: No focal deficit present.     Mental Status: She is alert and oriented to person, place, and time. Mental status is at baseline.  Psychiatric:        Mood and Affect: Mood normal.        Behavior: Behavior normal.        Thought Content: Thought content normal.      No results found for any visits on 12/05/23.  No results found for this or  any previous visit (from the past 2160 hours).      Assessment & Plan Vitamin D  deficiency, unspecified Iron deficiency B12 deficiency due to diet Other fatigue Checking labs today.  Will continue supplements as needed.   - Vitamin D  - Vitamin B12 - TSH  Mixed hyperlipidemia Checking labs today.  Continue current therapy for lipid control. Will modify as needed based on labwork results.   -CMP w/eGFR -Lipid Panel  Prediabetes A1C Continues to be in prediabetic ranges.  Will reassess at follow up after next lab check.  Patient counseled on dietary choices and verbalized understanding.   -CBC w/Diff -CMP w/eGFR -Hemoglobin A1C  Obesity (BMI 30-39.9) Patient educated on phentermine , risks/ benefits, and is willing to follow office protocols to continue medication.   Will reassess at follow up.     No follow-ups on file.   Total time spent: 20 minutes  ALAN CHRISTELLA ARRANT, FNP  12/05/2023   This document may have been prepared by [facility] Voice  Recognition software and as such may include unintentional dictation errors.

## 2023-12-06 LAB — CBC WITH DIFFERENTIAL/PLATELET
Basophils Absolute: 0.1 x10E3/uL (ref 0.0–0.2)
Basos: 1 %
EOS (ABSOLUTE): 0.4 x10E3/uL (ref 0.0–0.4)
Eos: 4 %
Hematocrit: 39.4 % (ref 34.0–46.6)
Hemoglobin: 12.3 g/dL (ref 11.1–15.9)
Immature Grans (Abs): 0.1 x10E3/uL (ref 0.0–0.1)
Immature Granulocytes: 1 %
Lymphocytes Absolute: 3 x10E3/uL (ref 0.7–3.1)
Lymphs: 29 %
MCH: 25.6 pg — ABNORMAL LOW (ref 26.6–33.0)
MCHC: 31.2 g/dL — ABNORMAL LOW (ref 31.5–35.7)
MCV: 82 fL (ref 79–97)
Monocytes Absolute: 0.5 x10E3/uL (ref 0.1–0.9)
Monocytes: 5 %
Neutrophils Absolute: 6.2 x10E3/uL (ref 1.4–7.0)
Neutrophils: 60 %
Platelets: 278 x10E3/uL (ref 150–450)
RBC: 4.8 x10E6/uL (ref 3.77–5.28)
RDW: 13.1 % (ref 11.7–15.4)
WBC: 10.1 x10E3/uL (ref 3.4–10.8)

## 2023-12-06 LAB — TSH: TSH: 1.51 u[IU]/mL (ref 0.450–4.500)

## 2023-12-06 LAB — CMP14+EGFR
ALT: 16 IU/L (ref 0–32)
AST: 19 IU/L (ref 0–40)
Albumin: 4.3 g/dL (ref 4.0–5.0)
Alkaline Phosphatase: 102 IU/L (ref 44–121)
BUN/Creatinine Ratio: 16 (ref 9–23)
BUN: 12 mg/dL (ref 6–20)
Bilirubin Total: 0.3 mg/dL (ref 0.0–1.2)
CO2: 23 mmol/L (ref 20–29)
Calcium: 9.3 mg/dL (ref 8.7–10.2)
Chloride: 102 mmol/L (ref 96–106)
Creatinine, Ser: 0.75 mg/dL (ref 0.57–1.00)
Globulin, Total: 2.8 g/dL (ref 1.5–4.5)
Glucose: 84 mg/dL (ref 70–99)
Potassium: 4.1 mmol/L (ref 3.5–5.2)
Sodium: 138 mmol/L (ref 134–144)
Total Protein: 7.1 g/dL (ref 6.0–8.5)
eGFR: 115 mL/min/1.73 (ref >59)

## 2023-12-06 LAB — LIPID PANEL
Chol/HDL Ratio: 2.7 ratio (ref 0.0–4.4)
Cholesterol, Total: 154 mg/dL (ref 100–199)
HDL: 58 mg/dL (ref >39)
LDL Chol Calc (NIH): 80 mg/dL (ref 0–99)
Triglycerides: 82 mg/dL (ref 0–149)
VLDL Cholesterol Cal: 16 mg/dL (ref 5–40)

## 2023-12-06 LAB — IRON,TIBC AND FERRITIN PANEL
Ferritin: 25 ng/mL (ref 15–150)
Iron Saturation: 11 % — ABNORMAL LOW (ref 15–55)
Iron: 42 ug/dL (ref 27–159)
Total Iron Binding Capacity: 387 ug/dL (ref 250–450)
UIBC: 345 ug/dL (ref 131–425)

## 2023-12-06 LAB — HEMOGLOBIN A1C
Est. average glucose Bld gHb Est-mCnc: 103 mg/dL
Hgb A1c MFr Bld: 5.2 % (ref 4.8–5.6)

## 2023-12-06 LAB — VITAMIN B12: Vitamin B-12: 493 pg/mL (ref 232–1245)

## 2023-12-06 LAB — VITAMIN D 25 HYDROXY (VIT D DEFICIENCY, FRACTURES): Vit D, 25-Hydroxy: 29.2 ng/mL — ABNORMAL LOW (ref 30.0–100.0)

## 2023-12-14 ENCOUNTER — Ambulatory Visit: Payer: Self-pay

## 2024-01-16 ENCOUNTER — Encounter: Payer: Self-pay | Admitting: Family

## 2024-01-16 NOTE — Assessment & Plan Note (Signed)
 Checking labs today.  Will continue supplements as needed.   - Vitamin D  - Vitamin B12 - TSH

## 2024-01-30 ENCOUNTER — Ambulatory Visit: Admitting: Family

## 2024-01-30 ENCOUNTER — Encounter: Payer: Self-pay | Admitting: Family

## 2024-01-30 VITALS — BP 118/76 | HR 82 | Temp 97.0°F | Ht 66.0 in | Wt 206.0 lb

## 2024-01-30 DIAGNOSIS — T753XXA Motion sickness, initial encounter: Secondary | ICD-10-CM

## 2024-01-30 DIAGNOSIS — Z0130 Encounter for examination of blood pressure without abnormal findings: Secondary | ICD-10-CM

## 2024-01-30 DIAGNOSIS — J0140 Acute pansinusitis, unspecified: Secondary | ICD-10-CM

## 2024-01-30 DIAGNOSIS — E669 Obesity, unspecified: Secondary | ICD-10-CM

## 2024-01-30 MED ORDER — PHENTERMINE HCL 37.5 MG PO TABS: 37.5000 mg | ORAL_TABLET | Freq: Every day | ORAL | 1 refills | Status: DC

## 2024-01-30 MED ORDER — SCOPOLAMINE 1 MG/3DAYS TD PT72: 1.0000 | MEDICATED_PATCH | TRANSDERMAL | 0 refills | Status: DC

## 2024-01-30 MED ORDER — AMOXICILLIN-POT CLAVULANATE 875-125 MG PO TABS: 1.0000 | ORAL_TABLET | Freq: Two times a day (BID) | ORAL | 0 refills | Status: DC

## 2024-01-30 MED ORDER — FLUCONAZOLE 150 MG PO TABS: 150.0000 mg | ORAL_TABLET | ORAL | 0 refills | Status: AC

## 2024-01-30 NOTE — Progress Notes (Unsigned)
 Acute Office Visit  Subjective:     Patient ID: Cheryl Vega, female    DOB: 03/10/2001, 23 y.o.   MRN: [id number]  Patient is in today for  Chief Complaint  Patient presents with  . Cough  . Sore Throat  . Sinusitis    Sore throat Cough Sinus pain   Cough The current episode started 1 to 4 weeks ago. The cough is Non-productive. Nothing aggravates the symptoms. She has tried a beta-agonist inhaler for the symptoms.  Sore Throat  Associated symptoms include coughing.  Sinusitis Associated symptoms include coughing.     Review of Systems  Respiratory:  Positive for cough.         Objective:    BP 118/76   Pulse 82   Temp (!) 97 F (36.1 C) (Tympanic)   Ht 5' 6 (1.676 m)   Wt 206 lb (93.4 kg)   SpO2 99%   BMI 33.25 kg/m   Physical Exam  No results found for any visits on 01/30/24.  Recent Results (from the past 2160 hours)  CMP14+EGFR     Status: None   Collection Time: 12/05/23  3:44 PM  Result Value Ref Range   Glucose 84 70 - 99 mg/dL   BUN 12 6 - 20 mg/dL   Creatinine, Ser 9.24 0.57 - 1.00 mg/dL   eGFR 884 >40 fO/fpw/8.26   BUN/Creatinine Ratio 16 9 - 23   Sodium 138 134 - 144 mmol/L   Potassium 4.1 3.5 - 5.2 mmol/L   Chloride 102 96 - 106 mmol/L   CO2 23 20 - 29 mmol/L   Calcium 9.3 8.7 - 10.2 mg/dL   Total Protein 7.1 6.0 - 8.5 g/dL   Albumin 4.3 4.0 - 5.0 g/dL   Globulin, Total 2.8 1.5 - 4.5 g/dL   Bilirubin Total 0.3 0.0 - 1.2 mg/dL   Alkaline Phosphatase 102 44 - 121 IU/L   AST 19 0 - 40 IU/L   ALT 16 0 - 32 IU/L  Lipid panel     Status: None   Collection Time: 12/05/23  3:44 PM  Result Value Ref Range   Cholesterol, Total 154 100 - 199 mg/dL   Triglycerides 82 0 - 149 mg/dL   HDL 58 >60 mg/dL   VLDL Cholesterol Cal 16 5 - 40 mg/dL   LDL Chol Calc (NIH) 80 0 - 99 mg/dL   Chol/HDL Ratio 2.7 0.0 - 4.4 ratio    Comment:                                   T. Chol/HDL Ratio                                             Men  Women                                1/2 Avg.Risk  3.4    3.3                                   Avg.Risk  5.0    4.4  2X Avg.Risk  9.6    7.1                                3X Avg.Risk 23.4   11.0   Iron, TIBC and Ferritin Panel     Status: Abnormal   Collection Time: 12/05/23  3:44 PM  Result Value Ref Range   Total Iron Binding Capacity 387 250 - 450 ug/dL   UIBC 654 868 - 574 ug/dL   Iron 42 27 - 840 ug/dL   Iron Saturation 11 (L) 15 - 55 %   Ferritin 25 15 - 150 ng/mL  VITAMIN D  25 Hydroxy (Vit-D Deficiency, Fractures)     Status: Abnormal   Collection Time: 12/05/23  3:44 PM  Result Value Ref Range   Vit D, 25-Hydroxy 29.2 (L) 30.0 - 100.0 ng/mL    Comment: Vitamin D  deficiency has been defined by the Institute of Medicine and an Endocrine Society practice guideline as a level of serum 25-OH vitamin D  less than 20 ng/mL (1,2). The Endocrine Society went on to further define vitamin D  insufficiency as a level between 21 and 29 ng/mL (2). 1. IOM (Institute of Medicine). 2010. Dietary reference    intakes for calcium and D. [place]: The    [employer]. 2. Holick MF, Binkley [place], Bischoff-Ferrari HA, et al.    Evaluation, treatment, and prevention of vitamin D     deficiency: an Endocrine Society clinical practice    guideline. JCEM. 2011 Jul; 96(7):1911-30.   Vitamin B12     Status: None   Collection Time: 12/05/23  3:44 PM  Result Value Ref Range   Vitamin B-12 493 232 - 1,245 pg/mL  CBC with Diff     Status: Abnormal   Collection Time: 12/05/23  3:44 PM  Result Value Ref Range   WBC 10.1 3.4 - 10.8 x10E3/uL   RBC 4.80 3.77 - 5.28 x10E6/uL   Hemoglobin 12.3 11.1 - 15.9 g/dL   Hematocrit 60.5 65.9 - 46.6 %   MCV 82 79 - 97 fL   MCH 25.6 (L) 26.6 - 33.0 pg   MCHC 31.2 (L) 31.5 - 35.7 g/dL   RDW 86.8 88.2 - 84.5 %   Platelets 278 150 - 450 x10E3/uL   Neutrophils 60 Not Estab. %   Lymphs 29 Not Estab. %   Monocytes 5 Not Estab. %    Eos 4 Not Estab. %   Basos 1 Not Estab. %   Neutrophils Absolute 6.2 1.4 - 7.0 x10E3/uL   Lymphocytes Absolute 3.0 0.7 - 3.1 x10E3/uL   Monocytes Absolute 0.5 0.1 - 0.9 x10E3/uL   EOS (ABSOLUTE) 0.4 0.0 - 0.4 x10E3/uL   Basophils Absolute 0.1 0.0 - 0.2 x10E3/uL   Immature Granulocytes 1 Not Estab. %   Immature Grans (Abs) 0.1 0.0 - 0.1 x10E3/uL  Hemoglobin A1c     Status: None   Collection Time: 12/05/23  3:44 PM  Result Value Ref Range   Hgb A1c MFr Bld 5.2 4.8 - 5.6 %    Comment:          Prediabetes: 5.7 - 6.4          Diabetes: >6.4          Glycemic control for adults with diabetes: <7.0    Est. average glucose Bld gHb Est-mCnc 103 mg/dL  TSH     Status: None   Collection Time: 12/05/23  3:44  PM  Result Value Ref Range   TSH 1.510 0.450 - 4.500 uIU/mL    Allergies as of 01/30/2024   No Known Allergies      Medication List        Accurate as of January 30, 2024  3:17 PM. If you have any questions, ask your nurse or doctor.          ferrous sulfate 325 (65 FE) MG tablet Take 325 mg by mouth daily with breakfast.   phentermine  37.5 MG tablet Commonly known as: ADIPEX-P  Take 1 tablet (37.5 mg total) by mouth daily before breakfast.            Assessment & Plan:   Assessment & Plan    No follow-ups on file.  Total time spent: {AMA time spent:29001} minutes  ALAN CHRISTELLA ARRANT, FNP  01/30/2024   This document may have been prepared by [facility] Voice Recognition software and as such may include unintentional dictation errors.

## 2024-02-01 ENCOUNTER — Encounter: Payer: Self-pay | Admitting: Family

## 2024-02-01 DIAGNOSIS — E669 Obesity, unspecified: Secondary | ICD-10-CM | POA: Insufficient documentation

## 2024-02-01 NOTE — Assessment & Plan Note (Signed)
 The patient has med the threshold to continue phentermine  therapy.  she has been reminded of the weight loss requirements to continue therapy.  she verbalized understanding and agreement with plan of care.

## 2024-02-05 ENCOUNTER — Encounter: Payer: Self-pay | Admitting: Family

## 2024-02-05 ENCOUNTER — Ambulatory Visit: Admitting: Family

## 2024-02-08 ENCOUNTER — Ambulatory Visit
Admission: EM | Admit: 2024-02-08 | Discharge: 2024-02-08 | Disposition: A | Discharge status: Home / Self Care | Attending: Emergency Medicine | Admitting: Emergency Medicine

## 2024-02-08 DIAGNOSIS — H60502 Unspecified acute noninfective otitis externa, left ear: Secondary | ICD-10-CM

## 2024-02-08 MED ORDER — OFLOXACIN 0.3 % OT SOLN: 10.0000 [drp] | Freq: Every day | OTIC | 0 refills | Status: DC

## 2024-02-08 NOTE — ED Triage Notes (Signed)
 Patient to Urgent Care with complaints of left sided ear pain/ radiates into jaw. Has noticed some swelling and drainage. Denies any known fevers.  Symptoms started 4 days ago.   Currently taking augmentin . Taking Excedrin.

## 2024-02-08 NOTE — Discharge Instructions (Addendum)
Use the ear drops as directed.  Follow up with your primary care provider if your symptoms are not improving.    

## 2024-02-08 NOTE — ED Provider Notes (Signed)
 Cheryl Vega    CSN: [id number] Arrival date & time: 02/08/24  1753      History   Chief Complaint Chief Complaint  Patient presents with   Otalgia    HPI Cheryl Vega is a 23 y.o. female.  Patient presents with 4-day history of left ear pain and drainage.  No fever.  She has taken Excedrin for the discomfort.  Patient is currently on Augmentin  for sinus infection.  She states her sinus symptoms have improved.  No congestion, cough, shortness of breath.  Patient was seen by her PCP on 01/30/2024; treated with Augmentin  for sinus infection.  The history is provided by the patient and medical records.    Past Medical History:  Diagnosis Date   Anemia    Calcium oxalate crystals in urine    followed by [facility] nephrology   Dysuria    Environmental and seasonal allergies    Menorrhagia     Patient Active Problem List   Diagnosis Date Noted   Obesity (BMI 30-39.9) 02/01/2024   Vitamin D  deficiency, unspecified 08/19/2023   Iron deficiency 08/19/2023    Past Surgical History:  Procedure Laterality Date   LIPOMA EXCISION N/A 01/04/2021   Procedure: EXCISION LIPOMA;  Surgeon: Dessa Reyes ORN, MD;  Location: [facility] ORS;  Service: General;  Laterality: N/A;  upper back    OB History     Gravida  0   Para  0   Term  0   Preterm  0   AB  0   Living  0      SAB  0   IAB  0   Ectopic  0   Multiple  0   Live Births  0            Home Medications    Prior to Admission medications   Medication Sig Start Date End Date Taking? Authorizing Provider  amoxicillin -clavulanate (AUGMENTIN ) 875-125 MG tablet Take 1 tablet by mouth 2 (two) times daily. 01/30/24  Yes Orlean Alan HERO, FNP  ferrous sulfate 325 (65 FE) MG tablet Take 325 mg by mouth daily with breakfast.   Yes [provider]  ofloxacin  (FLOXIN ) 0.3 % OTIC solution Place 10 drops into the left ear daily. 02/08/24  Yes Corlis Burnard DEL, NP  phentermine  (ADIPEX-P ) 37.5 MG tablet Take 1  tablet (37.5 mg total) by mouth daily before breakfast. 01/30/24  Yes Orlean Alan HERO, FNP  scopolamine  (TRANSDERM-SCOP) 1 MG/3DAYS Place 1 patch (1 mg total) onto the skin every 3 (three) days. 01/30/24   Orlean Alan HERO, FNP    Family History Family History  Problem Relation Age of Onset   Hypertension Mother    Hypertension Maternal Grandfather     Social History Social History   Tobacco Use   Smoking status: Never   Smokeless tobacco: Never  Vaping Use   Vaping status: Never Used  Substance Use Topics   Alcohol use: Never   Drug use: Never     Allergies   Patient has no known allergies.   Review of Systems Review of Systems  Constitutional:  Negative for chills and fever.  HENT:  Positive for ear discharge and ear pain. Negative for sore throat.   Respiratory:  Negative for cough and shortness of breath.      Physical Exam Triage Vital Signs ED Triage Vitals [02/08/24 1831]  Encounter Vitals Group     BP 128/82     Girls Systolic BP Percentile  Girls Diastolic BP Percentile      Boys Systolic BP Percentile      Boys Diastolic BP Percentile      Pulse Rate 81     Resp 18     Temp 98 F (36.7 C)     Temp src      SpO2 97 %     Weight      Height      Head Circumference      Peak Flow      Pain Score      Pain Loc      Pain Education      Exclude from Growth Chart    No data found.  Updated Vital Signs BP 128/82   Pulse 81   Temp 98 F (36.7 C)   Resp 18   LMP 02/06/2024   SpO2 97%   Visual Acuity Right Eye Distance:   Left Eye Distance:   Bilateral Distance:    Right Eye Near:   Left Eye Near:    Bilateral Near:     Physical Exam Constitutional:      General: She is not in acute distress. HENT:     Right Ear: Tympanic membrane normal.     Left Ear: Drainage and tenderness present.     Ears:     Comments: Unable to visualize left TM due to purulent drainage.    Nose: Nose normal.     Mouth/Throat:     Mouth: Mucous  membranes are moist.     Pharynx: Oropharynx is clear.  Cardiovascular:     Rate and Rhythm: Normal rate and regular rhythm.     Heart sounds: Normal heart sounds.  Pulmonary:     Effort: Pulmonary effort is normal. No respiratory distress.     Breath sounds: Normal breath sounds.  Neurological:     Mental Status: She is alert.      UC Treatments / Results  Labs (all labs ordered are listed, but only abnormal results are displayed) Labs Reviewed - No data to display  EKG   Radiology No results found.  Procedures Procedures (including critical care time)  Medications Ordered in UC Medications - No data to display  Initial Impression / Assessment and Plan / UC Course  I have reviewed the triage vital signs and the nursing notes.  Pertinent labs & imaging results that were available during my care of the patient were reviewed by me and considered in my medical decision making (see chart for details).    Acute left otitis externa.  Patient is currently on Augmentin  for a sinus infection.  Instructed her to finish this antibiotic as directed by her PCP.  Treating her otitis externa today with ofloxacin  eardrops.  Education provided on otitis externa.  Instructed patient to follow up with her PCP if her symptoms are not improving.  She agrees to plan of care.   Final Clinical Impressions(s) / UC Diagnoses   Final diagnoses:  Acute otitis externa of left ear, unspecified type     Discharge Instructions      Use the eardrops as directed.  Follow-up with your primary care provider if your symptoms are not improving.      ED Prescriptions     Medication Sig Dispense Auth. Provider   ofloxacin  (FLOXIN ) 0.3 % OTIC solution Place 10 drops into the left ear daily. 5 mL Corlis Burnard DEL, NP      PDMP not reviewed this encounter.   Corlis Burnard  H, NP 02/08/24 1908

## 2024-02-26 ENCOUNTER — Other Ambulatory Visit: Payer: Self-pay | Admitting: Family

## 2024-03-05 ENCOUNTER — Ambulatory Visit (INDEPENDENT_AMBULATORY_CARE_PROVIDER_SITE_OTHER): Admitting: Family

## 2024-03-05 ENCOUNTER — Encounter: Payer: Self-pay | Admitting: Family

## 2024-03-05 VITALS — BP 114/74 | HR 79 | Ht 66.0 in | Wt 205.0 lb

## 2024-03-05 DIAGNOSIS — Z131 Encounter for screening for diabetes mellitus: Secondary | ICD-10-CM

## 2024-03-05 DIAGNOSIS — L409 Psoriasis, unspecified: Secondary | ICD-10-CM | POA: Diagnosis not present

## 2024-03-05 DIAGNOSIS — E669 Obesity, unspecified: Secondary | ICD-10-CM | POA: Diagnosis not present

## 2024-03-05 DIAGNOSIS — Z0130 Encounter for examination of blood pressure without abnormal findings: Secondary | ICD-10-CM

## 2024-03-05 MED ORDER — PHENTERMINE HCL 37.5 MG PO TABS: 37.5000 mg | ORAL_TABLET | Freq: Every day | ORAL | 3 refills | Status: DC

## 2024-03-05 MED ORDER — MOMETASONE FUROATE 0.1 % EX CREA: 1.0000 | TOPICAL_CREAM | Freq: Every day | CUTANEOUS | 3 refills | Status: AC

## 2024-03-05 NOTE — Assessment & Plan Note (Signed)
 The patient has med the threshold to continue phentermine  therapy.  she has been reminded of the weight loss requirements to continue therapy.  she verbalized understanding and agreement with plan of care.

## 2024-03-05 NOTE — Progress Notes (Signed)
 Established Patient Office Visit  Subjective:  Patient ID: Cheryl Vega, female    DOB: [birth year]  Age: 23 y.o. MRN: [id number]  Chief Complaint  Patient presents with   Follow-up    2 month follow up    Patient is here today for her 2 months follow up.  She has been feeling fairly well since last appointment.   She does have additional concerns to discuss today.  Requested refill of topical psoriasis medication previously prescribed by [facility] dermatologist approximately 2 years ago.  Identified medication as mometasone 0.1% cream via photo confirmation.  Reports ongoing joint discomfort with frequent need to pop knees, hips, and ankles for relief.  Describes morning knee stiffness requiring manipulation before normal function. Notes pain relief with joint popping but discomfort when unable to achieve this.   Tolerating phentermine  well with good energy levels.  Labs are not due today.  She needs refills.   I have reviewed her active problem list, medication list, allergies, notes from last encounter, lab results for her appointment today.      No other concerns at this time.   Past Medical History:  Diagnosis Date   Anemia    Calcium oxalate crystals in urine    followed by [facility] nephrology   Dysuria    Environmental and seasonal allergies    Menorrhagia     Past Surgical History:  Procedure Laterality Date   LIPOMA EXCISION N/A 01/04/2021   Procedure: EXCISION LIPOMA;  Surgeon: Dessa Reyes ORN, MD;  Location: [facility] ORS;  Service: General;  Laterality: N/A;  upper back    Social History   Socioeconomic History   Marital status: Single    Spouse name: Not on file   Number of children: Not on file   Years of education: Not on file   Highest education level: Not on file  Occupational History   Not on file  Tobacco Use   Smoking status: Never   Smokeless tobacco: Never  Vaping Use   Vaping status: Never Used  Substance and Sexual Activity   Alcohol  use: Never   Drug use: Never   Sexual activity: Never    Birth control/protection: Patch  Other Topics Concern   Not on file  Social History Narrative   Not on file   Social Drivers of Health   Financial Resource Strain: Not on file  Food Insecurity: Not on file  Transportation Needs: Not on file  Physical Activity: Not on file  Stress: Not on file  Social Connections: Not on file  Intimate Partner Violence: Not on file    Family History  Problem Relation Age of Onset   Hypertension Mother    Hypertension Maternal Grandfather     No Known Allergies  Review of Systems  Skin:  Positive for itching and rash.  All other systems reviewed and are negative.      Objective:   BP 114/74   Pulse 79   Ht 5' 6 (1.676 m)   Wt 205 lb (93 kg)   LMP 02/06/2024   SpO2 99%   BMI 33.09 kg/m   Vitals:   03/05/24 1450  BP: 114/74  Pulse: 79  Height: 5' 6 (1.676 m)  Weight: 205 lb (93 kg)  SpO2: 99%  BMI (Calculated): 33.1    Physical Exam Vitals and nursing note reviewed.  Constitutional:      Appearance: Normal appearance. She is normal weight.  HENT:     Head: Normocephalic.  Eyes:  Extraocular Movements: Extraocular movements intact.     Conjunctiva/sclera: Conjunctivae normal.     Pupils: Pupils are equal, round, and reactive to light.  Cardiovascular:     Rate and Rhythm: Normal rate.  Pulmonary:     Effort: Pulmonary effort is normal.  Neurological:     General: No focal deficit present.     Mental Status: She is alert and oriented to person, place, and time. Mental status is at baseline.  Psychiatric:        Mood and Affect: Mood normal.        Behavior: Behavior normal.        Thought Content: Thought content normal.        Judgment: Judgment normal.      No results found for any visits on 03/05/24.  No results found for this or any previous visit (from the past 2160 hours).     Assessment & Plan Psoriasis stable, well-controlled with  topical therapy Refill mometasone 0.1% cream Obesity (BMI 30-39.9) The patient has med the threshold to continue phentermine  therapy.  she has been reminded of the weight loss requirements to continue therapy.  she verbalized understanding and agreement with plan of care.      Return in about 3 months (around 06/05/2024) for F/U. Labs at next appointment.  Total time spent: 20 minutes. This time includes review of previous notes and results and patient face to face interaction during today's visit.   ALAN CHRISTELLA ARRANT, FNP  03/05/2024   This document may have been prepared by [facility] Voice Recognition software and as such may include unintentional dictation errors.

## 2024-04-07 ENCOUNTER — Ambulatory Visit: Admission: EM | Admit: 2024-04-07 | Discharge: 2024-04-07 | Discharge status: Against Medical Advice

## 2024-06-05 ENCOUNTER — Encounter: Payer: Self-pay | Admitting: Family

## 2024-06-05 ENCOUNTER — Ambulatory Visit (INDEPENDENT_AMBULATORY_CARE_PROVIDER_SITE_OTHER): Admitting: Family

## 2024-06-05 VITALS — BP 112/76 | HR 75 | Ht 66.0 in | Wt 212.2 lb

## 2024-06-05 DIAGNOSIS — L409 Psoriasis, unspecified: Secondary | ICD-10-CM

## 2024-06-05 DIAGNOSIS — E559 Vitamin D deficiency, unspecified: Secondary | ICD-10-CM

## 2024-06-05 DIAGNOSIS — E611 Iron deficiency: Secondary | ICD-10-CM | POA: Diagnosis not present

## 2024-06-05 DIAGNOSIS — E669 Obesity, unspecified: Secondary | ICD-10-CM | POA: Diagnosis not present

## 2024-06-05 DIAGNOSIS — Z0130 Encounter for examination of blood pressure without abnormal findings: Secondary | ICD-10-CM

## 2024-06-05 NOTE — Assessment & Plan Note (Signed)
 Continue iron supplement.  Patient aware that this could cause more stomach issues when she becomes pregnant.

## 2024-06-05 NOTE — Progress Notes (Signed)
 "  Established Patient Office Visit  Subjective:  Patient ID: Cheryl Vega, female    DOB: 2001-03-30  Age: 24 y.o. MRN: [id number]  Chief Complaint  Patient presents with   Follow-up    3 month follow up    Patient is here today for her 3 months follow up.  She has been feeling well since last appointment.   She does have additional concerns to discuss today.  Labs are not due today.  She does not need refills.   I have reviewed her active problem list, medication list, allergies, health maintenance, notes from last encounter, lab results for her appointment today.    No other concerns at this time.   Past Medical History:  Diagnosis Date   Anemia    Calcium oxalate crystals in urine    followed by [facility] nephrology   Dysuria    Environmental and seasonal allergies    Menorrhagia     Past Surgical History:  Procedure Laterality Date   LIPOMA EXCISION N/A 01/04/2021   Procedure: EXCISION LIPOMA;  Surgeon: Dessa Reyes ORN, MD;  Location: [facility] ORS;  Service: General;  Laterality: N/A;  upper back    Social History   Socioeconomic History   Marital status: Single    Spouse name: Not on file   Number of children: Not on file   Years of education: Not on file   Highest education level: Not on file  Occupational History   Not on file  Tobacco Use   Smoking status: Never   Smokeless tobacco: Never  Vaping Use   Vaping status: Never Used  Substance and Sexual Activity   Alcohol use: Never   Drug use: Never   Sexual activity: Never    Birth control/protection: Patch  Other Topics Concern   Not on file  Social History Narrative   Not on file   Social Drivers of Health   Tobacco Use: Low Risk (06/05/2024)   Patient History    Smoking Tobacco Use: Never    Smokeless Tobacco Use: Never    Passive Exposure: Not on file  Financial Resource Strain: Not on file  Food Insecurity: Not on file  Transportation Needs: Not on file  Physical Activity: Not on file   Stress: Not on file  Social Connections: Not on file  Intimate Partner Violence: Not on file  Depression (PHQ2-9): Medium Risk (06/20/2023)   Depression (PHQ2-9)    PHQ-2 Score: 6  Alcohol Screen: Not on file  Housing: Unknown (11/24/2023)   Received from [facility]   Epic    Unable to Pay for Housing in the Last Year: Not on file    Number of Times Moved in the Last Year: Not on file    At any time in the past 12 months, were you homeless or living in a shelter (including now)?: No  Utilities: Not on file  Health Literacy: Not on file    Family History  Problem Relation Age of Onset   Hypertension Mother    Hypertension Maternal Grandfather     Allergies[1]  Review of Systems  All other systems reviewed and are negative.      Objective:   BP 112/76   Pulse 75   Ht 5' 6 (1.676 m)   Wt 212 lb 3.2 oz (96.3 kg)   SpO2 99%   BMI 34.25 kg/m   Vitals:   06/05/24 1502  BP: 112/76  Pulse: 75  Height: 5' 6 (1.676 m)  Weight: 212 lb 3.2 oz (96.3 kg)  SpO2: 99%  BMI (Calculated): 34.27    Physical Exam Vitals and nursing note reviewed.  Constitutional:      Appearance: Normal appearance. She is normal weight.  HENT:     Head: Normocephalic.  Eyes:     Extraocular Movements: Extraocular movements intact.     Conjunctiva/sclera: Conjunctivae normal.     Pupils: Pupils are equal, round, and reactive to light.  Cardiovascular:     Rate and Rhythm: Normal rate.  Pulmonary:     Effort: Pulmonary effort is normal.  Neurological:     General: No focal deficit present.     Mental Status: She is alert and oriented to person, place, and time. Mental status is at baseline.  Psychiatric:        Mood and Affect: Mood normal.        Behavior: Behavior normal.        Thought Content: Thought content normal.      No results found for any visits on 06/05/24.  No results found for this or any previous visit (from the past 2160 hours).      Assessment & Plan Iron deficiency Continue iron supplement.  Patient aware that this could cause more stomach issues when she becomes pregnant.   Vitamin D  deficiency, unspecified Will continue supplements as needed.   Obesity (BMI 30-39.9) Patient stopped and advised to continue holding phentermine .   Psoriasis Patient stable.  Well controlled with current therapy.   Continue current meds.      Return in about 3 months (around 09/03/2024).   Total time spent: 20 minutes  ALAN CHRISTELLA ARRANT, FNP  06/05/2024   This document may have been prepared by [facility] Voice Recognition software and as such may include unintentional dictation errors.     [1] No Known Allergies  "

## 2024-06-05 NOTE — Assessment & Plan Note (Signed)
 Will continue supplements as needed.

## 2024-06-05 NOTE — Assessment & Plan Note (Signed)
 Patient stopped and advised to continue holding phentermine .

## 2024-06-10 ENCOUNTER — Encounter (HOSPITAL_COMMUNITY): Payer: Self-pay

## 2024-06-10 ENCOUNTER — Other Ambulatory Visit: Payer: Self-pay

## 2024-06-10 ENCOUNTER — Emergency Department (HOSPITAL_COMMUNITY)
Admission: EM | Admit: 2024-06-10 | Discharge: 2024-06-10 | Disposition: A | Discharge status: Home / Self Care | Attending: Emergency Medicine | Admitting: Emergency Medicine

## 2024-06-10 DIAGNOSIS — Y9323 Activity, snow (alpine) (downhill) skiing, snow boarding, sledding, tobogganing and snow tubing: Secondary | ICD-10-CM | POA: Diagnosis not present

## 2024-06-10 DIAGNOSIS — S0003XA Contusion of scalp, initial encounter: Secondary | ICD-10-CM | POA: Diagnosis not present

## 2024-06-10 DIAGNOSIS — S0990XA Unspecified injury of head, initial encounter: Secondary | ICD-10-CM

## 2024-06-10 NOTE — Discharge Instructions (Addendum)
 You were seen in the ER for a head injury.  You may take Tylenol  and ibuprofen for the pain.  If the symptoms continue you may follow-up with your primary care.  If you start to develop any worsening headaches, blurred vision, difficulty speaking, loss of consciousness, vomiting, any other changes please return to the ER.

## 2024-06-10 NOTE — ED Provider Notes (Signed)
 " [place] EMERGENCY DEPARTMENT AT [facility] Provider Note   CSN: [id number] Arrival date & time: 06/10/24  1853     Patient presents with: Head Injury   Cheryl Vega is a 24 y.o. female.    Head Injury 24 year old female presenting after a head injury.  Patient reports that she was skiing with her niece when they crashed into a pole.  Patient reporting that she is having some head pain that is a 3 out of 10.  Patient denies any loss of consciousness, dizziness, blurred vision, nausea, vomiting.  Patient reports not having any other symptoms.     Prior to Admission medications  Medication Sig Start Date End Date Taking? Authorizing Provider  ferrous sulfate 325 (65 FE) MG tablet Take 325 mg by mouth daily with breakfast.    [provider]  mometasone  (ELOCON ) 0.1 % cream Apply 1 Application topically daily. 03/05/24   Orlean Alan HERO, FNP    Allergies: Patient has no known allergies.    Review of Systems  All other systems reviewed and are negative.   Updated Vital Signs BP 127/81   Pulse 78   Temp 98.2 F (36.8 C)   Resp 14   Ht 5' 6 (1.676 m)   Wt 96.3 kg   SpO2 100%   BMI 34.25 kg/m   Physical Exam Vitals and nursing note reviewed.  HENT:     Head:      Mouth/Throat:     Pharynx: Oropharynx is clear.  Eyes:     Pupils: Pupils are equal, round, and reactive to light.  Neck:     Comments: No tenderness to palpation of the neck. Cardiovascular:     Rate and Rhythm: Normal rate.     Pulses: Normal pulses.  Pulmonary:     Effort: Pulmonary effort is normal.     Breath sounds: Normal breath sounds.  Abdominal:     General: Abdomen is flat. Bowel sounds are normal.     Palpations: Abdomen is soft.  Musculoskeletal:     Cervical back: Full passive range of motion without pain and normal range of motion.  Skin:    General: Skin is warm and dry.  Neurological:     General: No focal deficit present.     Mental Status: She is  alert.     GCS: GCS eye subscore is 4. GCS verbal subscore is 5. GCS motor subscore is 6.     Cranial Nerves: Cranial nerves 2-12 are intact. No cranial nerve deficit.     Sensory: Sensation is intact. No sensory deficit.     Motor: Motor function is intact. No weakness.     Coordination: Coordination is intact. Coordination normal.     Gait: Gait is intact. Gait normal.  Psychiatric:        Mood and Affect: Mood normal.        Behavior: Behavior normal.        Thought Content: Thought content normal.        Judgment: Judgment normal.     (all labs ordered are listed, but only abnormal results are displayed) Labs Reviewed - No data to display  EKG: None  Radiology: No results found.   Procedures   Medications Ordered in the ED - No data to display  Medical Decision Making  Impression: 24 year old female presenting with head pain.  Differential diagnosis includes concussion, hematoma, TBI  Additional History: Patient is able to provide history.  Also reviewed other outpatient notes.  Labs: None  Imaging: None  ED Course/Meds: 24 year old female presenting after a sledding accident.  Patient was well-appearing and in no acute distress.  Patient reports that she was with her niece when they crashed into a pole.  Her niece was having to get checked out so she wanted to get checked out also.  Patient denies any loss of consciousness, vomiting, seizures, blurred vision, dizziness, neck pain.  Patient has a small hematoma noted to the left temporal region.  Patient reports the pain is a 3 out of 10.  She was offered some Tylenol  to help with the pain however she denied.  [place] CT head rule did not suggest doing a head CT based off of her exam.  Physical exam was unremarkable.  Patient was neurologically intact.  Patient was given strict return precautions including if she developed any nausea, vomiting, blurred vision, worsening headache, loss of  consciousness, or any other symptoms.  She and her husband both verbally agreed to these.  All questions were answered.  Patient remained stable while in the ER and at discharge.      Final diagnoses:  None    ED Discharge Orders     None          Cheryl Vega 06/10/24 1927  "

## 2024-06-21 NOTE — Telephone Encounter (Signed)
 Pt was seen in ED for the ear pain as well as seeing the pcp for this

## 2024-09-09 ENCOUNTER — Ambulatory Visit: Admitting: Family
